# Patient Record
Sex: Male | Born: 1985 | Race: White | Hispanic: No | Marital: Married | Smoking: Former smoker
Health system: Southern US, Community
[De-identification: ages and names within clinical notes are randomized; demographics above are authoritative.]

## PROBLEM LIST (undated history)

## (undated) DIAGNOSIS — E0590 Thyrotoxicosis, unspecified without thyrotoxic crisis or storm: Secondary | ICD-10-CM

## (undated) DIAGNOSIS — J45909 Unspecified asthma, uncomplicated: Secondary | ICD-10-CM

## (undated) DIAGNOSIS — S82899A Other fracture of unspecified lower leg, initial encounter for closed fracture: Secondary | ICD-10-CM

## (undated) DIAGNOSIS — E7800 Pure hypercholesterolemia, unspecified: Secondary | ICD-10-CM

## (undated) DIAGNOSIS — E119 Type 2 diabetes mellitus without complications: Secondary | ICD-10-CM

## (undated) HISTORY — PX: OTHER SURGICAL HISTORY: SHX169

## (undated) HISTORY — DX: Thyrotoxicosis, unspecified without thyrotoxic crisis or storm: E05.90

## (undated) HISTORY — DX: Unspecified asthma, uncomplicated: J45.909

## (undated) HISTORY — DX: Pure hypercholesterolemia, unspecified: E78.00

---
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
## 2007-03-10 ENCOUNTER — Encounter: Payer: Self-pay | Admitting: Family Medicine

## 2012-05-18 HISTORY — PX: MOUTH SURGERY: SHX715

## 2013-09-27 ENCOUNTER — Encounter (INDEPENDENT_AMBULATORY_CARE_PROVIDER_SITE_OTHER): Payer: Self-pay

## 2013-09-27 ENCOUNTER — Encounter: Payer: Self-pay | Admitting: Neurology

## 2013-09-27 ENCOUNTER — Ambulatory Visit (INDEPENDENT_AMBULATORY_CARE_PROVIDER_SITE_OTHER): Payer: BC Managed Care – PPO | Admitting: Neurology

## 2013-09-27 VITALS — BP 116/82 | HR 76 | Ht 71.0 in | Wt 250.0 lb

## 2013-09-27 DIAGNOSIS — R209 Unspecified disturbances of skin sensation: Secondary | ICD-10-CM

## 2013-09-27 DIAGNOSIS — G4733 Obstructive sleep apnea (adult) (pediatric): Secondary | ICD-10-CM

## 2013-09-27 DIAGNOSIS — R202 Paresthesia of skin: Secondary | ICD-10-CM

## 2013-09-27 MED ORDER — GABAPENTIN 100 MG PO CAPS: 300.0000 mg | ORAL_CAPSULE | Freq: Three times a day (TID) | ORAL | Status: DC

## 2013-09-27 NOTE — Progress Notes (Signed)
PATIENT: Jeffery Soto DOB: 1985-11-01  HISTORICAL  Jeffery Soto is a 28 years old right-handed male, is referred by his dentist Dr.Mohorn for evaluation right upper tooth pain.  He had past medical history of hypothyroidism, asthma, hyperlipidemia, obesity,  He had teeth #8, and 9 root canal and crown placement in June 2014, recovered well,   he previously had root canal, and crown done at #7, around April 2015, he noticed shooting burning sensation above his #7, lasting for a few weeks, over the past one week, it has much improved, and at its worst, he described constant burning discomfort, difficulty sleeping at nighttime, missed  He was reevaluated by his dentist he Sep 25 2013, there was no signs of swelling, inflammation, he has already been treated with antibiotics,   He also has a history of obesity, snoring a lot at nighttime, excessive fatigue, sleepiness at daytime  REVIEW OF SYSTEMS: Full 14 system review of systems performed and notable only for hyperlipidemia, hypothyroidism, asthma,   ALLERGIES: Allergies  Allergen Reactions  . Amoxicillin   . Penicillins     HOME MEDICATIONS: No current outpatient prescriptions on file prior to visit.   No current facility-administered medications on file prior to visit.    PAST MEDICAL HISTORY: Past Medical History  Diagnosis Date  . Hyperthyroidism   . Asthma   . High cholesterol     PAST SURGICAL HISTORY: Past Surgical History  Procedure Laterality Date  . None      FAMILY HISTORY: Family History  Problem Relation Age of Onset  . Heart attack Paternal Grandfather   . Irritable bowel syndrome Mother     SOCIAL HISTORY:  History   Social History  . Marital Status: Single    Spouse Name: N/A    Number of Children: 0  . Years of Education: Masters   Occupational History  . Not on file.   Social History Main Topics  . Smoking status: Former Smoker    Quit date: 07/02/2013  . Smokeless tobacco:  Never Used  . Alcohol Use: Yes     Comment: 2 times a week   . Drug Use: No  . Sexual Activity: Not on file   Other Topics Concern  . Not on file   Social History Narrative   Patient lives at home home with girlfriend Merry ProudBrandi.   Patient has no children.    Masters in Audiological scientistaccounting    Patient is not married.    Patient is left handed.    Patient is currently working full time.      PHYSICAL EXAM   Filed Vitals:   09/27/13 0830  BP: 116/82  Pulse: 76  Height: 5\' 11"  (1.803 m)  Weight: 250 lb (113.399 kg)    Not recorded    Body mass index is 34.88 kg/(m^2).   Generalized: In no acute distress  Neck: Supple, no carotid bruits   Cardiac: Regular rate rhythm  Pulmonary: Clear to auscultation bilaterally  Musculoskeletal: No deformity  Neurological examination  Mentation: Alert oriented to time, place, history taking, and causual conversation  Cranial nerve II-XII: Pupils were equal round reactive to light. Extraocular movements were full.  Visual field were full on confrontational test. Bilateral fundi were sharp.  Facial sensation and strength were normal. Hearing was intact to finger rubbing bilaterally. Uvula tongue midline.  Head turning and shoulder shrug and were normal and symmetric.Tongue protrusion into cheek strength was normal.  Motor: Normal tone, bulk and strength.  Sensory: Intact to fine touch, pinprick, preserved vibratory sensation, and proprioception at toes.  Coordination: Normal finger to nose, heel-to-shin bilaterally there was no truncal ataxia  Gait: Rising up from seated position without assistance, normal stance, without trunk ataxia, moderate stride, good arm swing, smooth turning, able to perform tiptoe, and heel walking without difficulty.   Romberg signs: Negative  Deep tendon reflexes: Brachioradialis 2/2, biceps 2/2, triceps 2/2, patellar 2/2, Achilles 2/2, plantar responses were flexor bilaterally.   DIAGNOSTIC DATA (LABS, IMAGING,  TESTING) - I reviewed patient records, labs, notes, testing and imaging myself where available.  ASSESSMENT AND PLAN  Jeffery Soto is a 28 y.o. male complains of   right upper, area discomfort, shooting pain, suggestive of neurogenic pain, over the past couple weeks, it has much improved, he also complains of symptoms suggestive of obstructive sleep apnea,   1. sleep study 2 Neurontin 3 return to clinic in 3 months with [name]. Levert Feinstein.   Daniyla Pfahler, M.D. Ph.D.  [facility] [address], [address] [place], [place] [address] [phone]

## 2013-09-29 ENCOUNTER — Telehealth: Payer: Self-pay | Admitting: Neurology

## 2013-09-29 DIAGNOSIS — R400 Somnolence: Secondary | ICD-10-CM

## 2013-09-29 DIAGNOSIS — R0683 Snoring: Secondary | ICD-10-CM

## 2013-09-29 DIAGNOSIS — E669 Obesity, unspecified: Secondary | ICD-10-CM

## 2013-09-29 NOTE — Telephone Encounter (Signed)
Dr. Terrace Soto..refers patient for attended sleep study.  Height: 5'11  Weight: 250 lbs.  BMI: 34.88  Past Medical History:  Hyperthyroidism  Asthma  High cholesterol   Sleep Symptoms: He also has a history of obesity, snoring a lot at nighttime, excessive fatigue, sleepiness at daytime    Epworth Score: 5 ( I called and spoke with the patient to get his ESS score.  Medications: Fexofenadine HCl ALLEGRA PO Take by mouth as needed. Gabapentin (Cap) NEURONTIN 100 MG Take 3 capsules (300 mg total) by mouth 3 (three) times daily.    Insurance: [employer]  Dr. Zannie Soto's Assessment and Plan:   Jeffery Soto is a 28 y.o. male complains of right upper, area discomfort, shooting pain, suggestive of neurogenic pain, over the past couple weeks, it has much improved, he also complains of symptoms suggestive of obstructive sleep apnea,  1. sleep study  2 Neurontin  3 return to clinic in 3 months with Jeffery Soto. .   Please review patient information and submit instructions for scheduling and orders for sleep technologist.  Thank you!

## 2013-10-04 NOTE — Telephone Encounter (Signed)
Sleep study request review: This patient has an underlying medical history of allergies, obesity and paresthesias and is referred by Dr. Terrace ArabiaYan for an attended sleep study due to a report of snoring, daytime somnolence. I will order a split-night sleep study and see the patient in sleep medicine consultation afterwards. Huston FoleySaima Sherae Santino, MD, PhD [facility])

## 2013-11-29 ENCOUNTER — Ambulatory Visit (INDEPENDENT_AMBULATORY_CARE_PROVIDER_SITE_OTHER): Payer: BC Managed Care – PPO | Admitting: Neurology

## 2013-11-29 ENCOUNTER — Encounter: Payer: Self-pay | Admitting: Neurology

## 2013-11-29 DIAGNOSIS — G4733 Obstructive sleep apnea (adult) (pediatric): Secondary | ICD-10-CM

## 2013-11-29 DIAGNOSIS — R400 Somnolence: Secondary | ICD-10-CM

## 2013-11-29 DIAGNOSIS — E669 Obesity, unspecified: Secondary | ICD-10-CM

## 2013-11-29 DIAGNOSIS — R0683 Snoring: Secondary | ICD-10-CM

## 2013-12-15 ENCOUNTER — Telehealth: Payer: Self-pay | Admitting: Neurology

## 2013-12-15 DIAGNOSIS — G4733 Obstructive sleep apnea (adult) (pediatric): Secondary | ICD-10-CM

## 2013-12-15 NOTE — Telephone Encounter (Signed)
Please call and notify the patient that the recent sleep study did confirm the diagnosis of obstructive sleep apnea and that I recommend treatment for this in the form of CPAP, as he has mild to moderate sleep apnea. This will require a repeat sleep study for proper titration and mask fitting. Please explain to patient and arrange for a CPAP titration study. I have placed an order in the chart. Thanks, Huston FoleySaima Faris Coolman, MD, PhD [facility]

## 2013-12-18 NOTE — Telephone Encounter (Signed)
Called the patient regarding sleep study results.  Advised the patient of finding of mild osa, however Dr. Frances FurbishAthar recommends treatment with CPAP which would require a second sleep study.  Pt agreed and appt was scheduled.  A copy of this report was sent to Dr. Terrace ArabiaYan for processing and the patient

## 2014-01-12 ENCOUNTER — Encounter: Payer: Self-pay | Admitting: Nurse Practitioner

## 2014-01-12 ENCOUNTER — Ambulatory Visit (INDEPENDENT_AMBULATORY_CARE_PROVIDER_SITE_OTHER): Payer: BC Managed Care – PPO | Admitting: Nurse Practitioner

## 2014-01-12 VITALS — BP 126/76 | HR 71 | Ht 71.0 in | Wt 249.8 lb

## 2014-01-12 DIAGNOSIS — G4733 Obstructive sleep apnea (adult) (pediatric): Secondary | ICD-10-CM

## 2014-01-12 DIAGNOSIS — R202 Paresthesia of skin: Secondary | ICD-10-CM

## 2014-01-12 DIAGNOSIS — R209 Unspecified disturbances of skin sensation: Secondary | ICD-10-CM

## 2014-01-12 NOTE — Progress Notes (Signed)
[facility]  PATIENT: Jeffery Soto DOB: 04-16-1986   REASON FOR VISIT: Followup for numbness of the face of obstructive sleep apnea   HISTORY OF PRESENT ILLNESS: Jeffery Soto, 28 year old male returns for followup. He was last seen Dr. Terrace Arabia 09/27/2013. He was placed on gabapentin for his numbness paresthesias of the face particularly in the gums. The gabapentin is beneficial however he does have some breakthrough pain. He has also had a sleep study which was positive for obstructive sleep apnea. He is due to have CPAP titration on September 9. He returns for reevaluation  HISTORY: Jeffery Soto is a 28 years old right-handed male, is referred by his dentist Dr.Mohorn for evaluation right upper tooth pain.  He had past medical history of hypothyroidism, asthma, hyperlipidemia, obesity,  He had teeth #8, and 9 root canal and crown placement in June 2014, recovered well, he previously had root canal, and crown done at #7, around April 2015, he noticed shooting burning sensation above his #7, lasting for a few weeks, over the past one week, it has much improved, and at its worst, he described constant burning discomfort, difficulty sleeping at nighttime, missed  He was reevaluated by his dentist he Sep 25 2013, there was no signs of swelling, inflammation, he has already been treated with antibiotics,  He also has a history of obesity, snoring a lot at nighttime, excessive fatigue, sleepiness at daytime      REVIEW OF SYSTEMS: Full 14 system review of systems performed and notable only for those listed, all others are neg:  Constitutional: N/A  Cardiovascular: N/A  Ear/Nose/Throat: N/A  Skin: N/A  Eyes: N/A  Respiratory: N/A  Gastroitestinal: N/A  Hematology/Lymphatic: N/A  Endocrine: N/A Musculoskeletal:N/A  Allergy/Immunology: N/A  Neurological: Numbness of the gums Psychiatric: N/A Sleep : Obstructive sleep apnea   ALLERGIES: Allergies  Allergen  Reactions  . Amoxicillin   . Penicillins     HOME MEDICATIONS: Outpatient Prescriptions Prior to Visit  Medication Sig Dispense Refill  . Fexofenadine HCl (ALLEGRA PO) Take by mouth as needed.      . gabapentin (NEURONTIN) 100 MG capsule Take 3 capsules (300 mg total) by mouth 3 (three) times daily.  270 capsule  6   No facility-administered medications prior to visit.    PAST MEDICAL HISTORY: Past Medical History  Diagnosis Date  . Hyperthyroidism   . Asthma   . High cholesterol     PAST SURGICAL HISTORY: Past Surgical History  Procedure Laterality Date  . None      FAMILY HISTORY: Family History  Problem Relation Age of Onset  . Heart attack Paternal Grandfather   . Irritable bowel syndrome Mother     SOCIAL HISTORY: History   Social History  . Marital Status: Single    Spouse Name: N/A    Number of Children: 0  . Years of Education: Masters   Occupational History  . Not on file.   Social History Main Topics  . Smoking status: Former Smoker    Quit date: 07/02/2013  . Smokeless tobacco: Never Used  . Alcohol Use: Yes     Comment: 2 times a week   . Drug Use: No  . Sexual Activity: Not on file   Other Topics Concern  . Not on file   Social History Narrative   Patient lives at home home with girlfriend Jeffery Soto.   Patient has no children.    Masters in Audiological scientist    Patient is not married.  Patient is left handed.    Patient is currently working full time.      PHYSICAL EXAM  Filed Vitals:   There is no weight on file to calculate BMI. Generalized: In no acute distress  Neck: Supple, no carotid bruits   Musculoskeletal: No deformity  Neurological examination  Mentation: Alert oriented to time, place, history taking, and causual conversation  Cranial nerve II-XII: Pupils were equal round reactive to light. Extraocular movements were full. Visual field were full on confrontational test. Facial sensation and strength were normal. Hearing was  intact to finger rubbing bilaterally. Uvula tongue midline. Head turning and shoulder shrug and were normal and symmetric.Tongue protrusion into cheek strength was normal.  Motor: Normal tone, bulk and strength.  Sensory: Intact to fine touch, pinprick, preserved vibratory sensation, and proprioception at toes.  Coordination: Normal finger to nose, heel-to-shin bilaterally there was no truncal ataxia  Gait: Rising up from seated position without assistance, normal stance, without trunk ataxia, moderate stride, good arm swing, smooth turning, able to perform tiptoe, and heel walking without difficulty.  Deep tendon reflexes: Brachioradialis 2/2, biceps 2/2, triceps 2/2, patellar 2/2, Achilles 2/2, plantar responses were flexor bilaterally.  DIAGNOSTIC DATA (LABS, IMAGING, TESTING) - ASSESSMENT AND PLAN  28 y.o. year old male  has a past medical history of right upper gum pain suggestive of neurogenic pain much improved with gabapentin. Sleep study shows obstructive sleep apnea, he has not had titration study.  Increase gabapentin to 3 capsules 4 times daily.Just refilled he will call for refill CPAP titration scheduled for September 9 Followup with me in 6 months Nilda Riggs, [facility], [facility], APRN  [facility] [address], [address] [place], [place] [address] [phone]

## 2014-01-12 NOTE — Patient Instructions (Addendum)
Increase gabapentin to 3 capsules 4 times daily. CPAP titration scheduled for September 9 Followup with me in 6 months

## 2014-01-24 ENCOUNTER — Ambulatory Visit (INDEPENDENT_AMBULATORY_CARE_PROVIDER_SITE_OTHER): Payer: BC Managed Care – PPO | Admitting: Neurology

## 2014-01-24 DIAGNOSIS — G4733 Obstructive sleep apnea (adult) (pediatric): Secondary | ICD-10-CM

## 2014-02-07 ENCOUNTER — Telehealth: Payer: Self-pay | Admitting: Neurology

## 2014-02-07 DIAGNOSIS — G4733 Obstructive sleep apnea (adult) (pediatric): Secondary | ICD-10-CM

## 2014-02-07 NOTE — Telephone Encounter (Signed)
Please call and inform patient that I have entered an order for treatment with PAP. He did well during the latest sleep study with CPAP. We will, therefore, arrange for a machine for home use through a DME (durable medical equipment) company of His choice; and I will see the patient back in follow-up in about 6 weeks. Please also explain to the patient that I will be looking out for compliance data downloaded from the machine, which can be done remotely through a modem at times or stored on an SD card in the back of the machine. At the time of the followup appointment we will discuss sleep study results and how it is going with PAP treatment at home. Please advise patient to bring His machine at the time of the visit; at least for the first visit, even though this is cumbersome. Bringing the machine for every visit after that may not be needed, but often helps for the first visit. Please also make sure, the patient has a follow-up appointment with me in about 6 weeks from the setup date, thanks.   Atziry Baranski, MD, PhD [facility] ([facility]  

## 2014-02-08 ENCOUNTER — Encounter: Payer: Self-pay | Admitting: Neurology

## 2014-02-13 NOTE — Telephone Encounter (Signed)
Patient was contacted and provided the results of his CPAP titration study.  Patient was informed that [facility] would be his DME company and he was fine with that.  Patient was instructed to contact office to set up a 6-8 week follow up once he was started on the therapy.  Patient was informed that a copy would be mailed out to him and a copy would be provided to Dr. Levert FeinsteinYijun Yan.

## 2014-03-22 ENCOUNTER — Encounter: Payer: Self-pay | Admitting: Neurology

## 2014-05-19 ENCOUNTER — Other Ambulatory Visit: Payer: Self-pay | Admitting: Neurology

## 2014-05-20 NOTE — Telephone Encounter (Signed)
Last OV note says: Increase gabapentin to 3 capsules 4 times daily

## 2014-05-29 ENCOUNTER — Encounter: Payer: Self-pay | Admitting: Internal Medicine

## 2014-08-13 ENCOUNTER — Telehealth: Payer: Self-pay | Admitting: *Deleted

## 2014-08-13 ENCOUNTER — Encounter: Payer: Self-pay | Admitting: *Deleted

## 2014-08-20 ENCOUNTER — Ambulatory Visit: Payer: BC Managed Care – PPO | Admitting: Nurse Practitioner

## 2014-09-12 NOTE — Telephone Encounter (Signed)
Jonne Plyashia Wright CMA, no longer working here.

## 2014-09-15 ENCOUNTER — Other Ambulatory Visit: Payer: Self-pay | Admitting: Neurology

## 2014-10-15 ENCOUNTER — Other Ambulatory Visit: Payer: Self-pay | Admitting: Neurology

## 2014-10-25 ENCOUNTER — Other Ambulatory Visit: Payer: Self-pay | Admitting: Neurology

## 2014-11-04 ENCOUNTER — Other Ambulatory Visit: Payer: Self-pay | Admitting: Neurology

## 2015-04-24 ENCOUNTER — Ambulatory Visit (INDEPENDENT_AMBULATORY_CARE_PROVIDER_SITE_OTHER): Payer: 59 | Admitting: Emergency Medicine

## 2015-04-24 VITALS — BP 136/84 | HR 82 | Temp 98.5°F | Resp 18 | Ht 70.0 in | Wt 263.4 lb

## 2015-04-24 DIAGNOSIS — R6883 Chills (without fever): Secondary | ICD-10-CM | POA: Diagnosis not present

## 2015-04-24 DIAGNOSIS — R5383 Other fatigue: Secondary | ICD-10-CM | POA: Diagnosis not present

## 2015-04-24 DIAGNOSIS — R42 Dizziness and giddiness: Secondary | ICD-10-CM | POA: Diagnosis not present

## 2015-04-24 DIAGNOSIS — R002 Palpitations: Secondary | ICD-10-CM | POA: Diagnosis not present

## 2015-04-24 LAB — COMPREHENSIVE METABOLIC PANEL
ALK PHOS: 61 U/L (ref 40–115)
ALT: 61 U/L — AB (ref 9–46)
AST: 41 U/L — ABNORMAL HIGH (ref 10–40)
Albumin: 4.6 g/dL (ref 3.6–5.1)
BUN: 12 mg/dL (ref 7–25)
CO2: 23 mmol/L (ref 20–31)
Calcium: 9.8 mg/dL (ref 8.6–10.3)
Chloride: 99 mmol/L (ref 98–110)
Creat: 0.81 mg/dL (ref 0.60–1.35)
GLUCOSE: 86 mg/dL (ref 65–99)
POTASSIUM: 4.8 mmol/L (ref 3.5–5.3)
Sodium: 141 mmol/L (ref 135–146)
Total Bilirubin: 0.5 mg/dL (ref 0.2–1.2)
Total Protein: 7.5 g/dL (ref 6.1–8.1)

## 2015-04-24 LAB — POCT URINALYSIS DIP (MANUAL ENTRY)
BILIRUBIN UA: NEGATIVE
Bilirubin, UA: NEGATIVE
Blood, UA: NEGATIVE
Glucose, UA: NEGATIVE
Leukocytes, UA: NEGATIVE
Nitrite, UA: NEGATIVE
Spec Grav, UA: 1.02
UROBILINOGEN UA: 0.2
pH, UA: 5.5

## 2015-04-24 LAB — GLUCOSE, POCT (MANUAL RESULT ENTRY): POC Glucose: 88 mg/dl (ref 70–99)

## 2015-04-24 LAB — POCT CBC
GRANULOCYTE PERCENT: 65.5 % (ref 37–80)
HCT, POC: 42 % — AB (ref 43.5–53.7)
HEMOGLOBIN: 14.6 g/dL (ref 14.1–18.1)
Lymph, poc: 2.1 (ref 0.6–3.4)
MCH: 28.9 pg (ref 27–31.2)
MCHC: 34.8 g/dL (ref 31.8–35.4)
MCV: 83.2 fL (ref 80–97)
MID (cbc): 0.5 (ref 0–0.9)
MPV: 8.2 fL (ref 0–99.8)
PLATELET COUNT, POC: 291 10*3/uL (ref 142–424)
POC Granulocyte: 5 (ref 2–6.9)
POC LYMPH PERCENT: 27.8 %L (ref 10–50)
POC MID %: 6.7 % (ref 0–12)
RBC: 5.05 M/uL (ref 4.69–6.13)
RDW, POC: 13.1 %
WBC: 7.6 10*3/uL (ref 4.6–10.2)

## 2015-04-24 LAB — LIPID PANEL
CHOL/HDL RATIO: 4.6 ratio (ref <=5.0)
Cholesterol: 227 mg/dL — ABNORMAL HIGH (ref 125–200)
HDL: 49 mg/dL (ref >=40)
LDL CALC: 141 mg/dL — AB (ref <130)
Triglycerides: 187 mg/dL — ABNORMAL HIGH (ref <150)
VLDL: 37 mg/dL — AB (ref <30)

## 2015-04-24 LAB — POC MICROSCOPIC URINALYSIS (UMFC): Mucus: ABSENT

## 2015-04-24 LAB — TSH: TSH: 4.655 u[IU]/mL — ABNORMAL HIGH (ref 0.350–4.500)

## 2015-04-24 LAB — POCT GLYCOSYLATED HEMOGLOBIN (HGB A1C): Hemoglobin A1C: 5.5

## 2015-04-24 LAB — HEMOGLOBIN A1C: HEMOGLOBIN A1C: 5.5 % (ref 4.0–6.0)

## 2015-04-24 NOTE — Progress Notes (Signed)
Subjective:  Patient ID: Jeffery Soto, male    DOB: [birth year]  Age: 29 y.o. MRN: [id number]  CC: Dizziness and Abdominal Pain   HPI Jeffery Soto presents  patient ill since Saturday with the sensation chills and night sweats. No nasal congestion postnasal drainage. No cough. No wheezing or shortness breath. No nausea or vomiting. No stool change. No pain or pressure in his ears. No sore throat. He has no rash. Today's developed a sensation that he is somewhat dizzy and has had difficulty concentrating yesterday. He has no history of injury. No antecedent illness he is concerned that he might be a diabetic.  History Jeffery Soto has a past medical history of Hyperthyroidism; Asthma; and High cholesterol.   He has past surgical history that includes None and Mouth surgery (2014).   His  family history includes Arthritis in his mother; Heart attack in his paternal grandfather; Irritable bowel syndrome in his mother.  He   reports that he quit smoking about 21 months ago. He has never used smokeless tobacco. He reports that he drinks alcohol. He reports that he does not use illicit drugs.  Outpatient Prescriptions Prior to Visit  Medication Sig Dispense Refill  . Fexofenadine HCl (ALLEGRA PO) Take by mouth as needed.    . gabapentin (NEURONTIN) 100 MG capsule Take 300 mg by mouth 4 (four) times daily.    Marland Kitchen. gabapentin (NEURONTIN) 100 MG capsule TAKE 3 CAPSULES (300 MG TOTAL) BY MOUTH 4 (FOUR) TIMES DAILY. 120 capsule 0   No facility-administered medications prior to visit.    Social History   Social History  . Marital Status: Single    Spouse Name: N/A  . Number of Children: 0  . Years of Education: Masters   Social History Main Topics  . Smoking status: Former Smoker    Quit date: 07/02/2013  . Smokeless tobacco: Never Used  . Alcohol Use: Yes     Comment: 2 times a week   . Drug Use: No  . Sexual Activity: Not Asked   Other Topics Concern  . None   Social History  Narrative   Patient lives at home home with girlfriend Jeffery Soto.   Patient has no children.    Masters in Audiological scientistaccounting    Patient is not married.    Patient is left handed.    Patient is currently working full time.      Review of Systems  Constitutional: Positive for chills and fatigue. Negative for fever and appetite change.  HENT: Negative for congestion, ear pain, postnasal drip, sinus pressure and sore throat.   Eyes: Negative for pain and redness.  Respiratory: Negative for cough, shortness of breath and wheezing.   Cardiovascular: Positive for palpitations. Negative for leg swelling.  Gastrointestinal: Negative for nausea, vomiting, abdominal pain, diarrhea, constipation and blood in stool.  Endocrine: Negative for polyuria.  Genitourinary: Negative for dysuria, urgency, frequency and flank pain.  Musculoskeletal: Negative for gait problem.  Skin: Negative for rash.  Neurological: Positive for dizziness. Negative for weakness and headaches.  Psychiatric/Behavioral: Positive for decreased concentration. Negative for confusion. The patient is not nervous/anxious.     Objective:  BP 136/84 mmHg  Pulse 82  Temp(Src) 98.5 F (36.9 C) (Oral)  Resp 18  Ht 5\' 10"  (1.778 m)  Wt 263 lb 6.4 oz (119.477 kg)  BMI 37.79 kg/m2  SpO2 98%  Physical Exam  Constitutional: He is oriented to person, place, and time. He appears well-developed and well-nourished. No distress.  HENT:  Head: Normocephalic and atraumatic.  Right Ear: External ear normal.  Left Ear: External ear normal.  Nose: Nose normal.  Eyes: Conjunctivae and EOM are normal. Pupils are equal, round, and reactive to light. No scleral icterus.  Neck: Normal range of motion. Neck supple. No tracheal deviation present.  Cardiovascular: Normal rate, regular rhythm and normal heart sounds.   Pulmonary/Chest: Effort normal. No respiratory distress. He has no wheezes. He has no rales.  Abdominal: He exhibits no mass. There is no  tenderness. There is no rebound and no guarding.  Musculoskeletal: He exhibits no edema.  Lymphadenopathy:    He has no cervical adenopathy.  Neurological: He is alert and oriented to person, place, and time. Coordination normal.  Skin: Skin is warm and dry. No rash noted.  Psychiatric: He has a normal mood and affect. His behavior is normal.      Assessment & Plan:   Jeffery Soto was seen today for dizziness and abdominal pain.  Diagnoses and all orders for this visit:  Dizzy -     POCT CBC -     POCT glucose (manual entry) -     POCT glycosylated hemoglobin (Hb A1C) -     POCT urinalysis dipstick -     POCT Microscopic Urinalysis (UMFC) -     Comprehensive metabolic panel -     TSH -     Lipid panel -     EKG 12-Lead   I am having Jeffery Soto maintain his Fexofenadine HCl (ALLEGRA PO) and gabapentin.  No orders of the defined types were placed in this encounter.    Appropriate red flag conditions were discussed with the patient as well as actions that should be taken.  Patient expressed his understanding.  Follow-up: Return if symptoms worsen or fail to improve.  Carmelina Dane, MD   Results for orders placed or performed in visit on 04/24/15  POCT CBC  Result Value Ref Range   WBC 7.6 4.6 - 10.2 K/uL   Lymph, poc 2.1 0.6 - 3.4   POC LYMPH PERCENT 27.8 10 - 50 %L   MID (cbc) 0.5 0 - 0.9   POC MID % 6.7 0 - 12 %M   POC Granulocyte 5.0 2 - 6.9   Granulocyte percent 65.5 37 - 80 %G   RBC 5.05 4.69 - 6.13 M/uL   Hemoglobin 14.6 14.1 - 18.1 g/dL   HCT, POC 16.1 (A) 09.6 - 53.7 %   MCV 83.2 80 - 97 fL   MCH, POC 28.9 27 - 31.2 pg   MCHC 34.8 31.8 - 35.4 g/dL   RDW, POC 04.5 %   Platelet Count, POC 291 142 - 424 K/uL   MPV 8.2 0 - 99.8 fL  POCT glucose (manual entry)  Result Value Ref Range   POC Glucose 88 70 - 99 mg/dl  POCT glycosylated hemoglobin (Hb A1C)  Result Value Ref Range   Hemoglobin A1C 5.5   POCT urinalysis dipstick  Result Value Ref Range    Color, UA yellow yellow   Clarity, UA clear clear   Glucose, UA negative negative   Bilirubin, UA negative negative   Ketones, POC UA negative negative   Spec Grav, UA 1.020    Blood, UA negative negative   pH, UA 5.5    Protein Ur, POC trace (A) negative   Urobilinogen, UA 0.2    Nitrite, UA Negative Negative   Leukocytes, UA Negative Negative  POCT Microscopic Urinalysis ([facility])  Result Value Ref Range   WBC,UR,HPF,POC None None WBC/hpf   RBC,UR,HPF,POC None None RBC/hpf   Bacteria None None, Too numerous to count   Mucus Absent Absent   Epithelial Cells, UR Per Microscopy None None, Too numerous to count cells/hpf

## 2015-04-24 NOTE — Patient Instructions (Signed)

## 2015-04-25 ENCOUNTER — Other Ambulatory Visit: Payer: Self-pay | Admitting: Emergency Medicine

## 2015-04-25 MED ORDER — LEVOTHYROXINE SODIUM 75 MCG PO TABS: 75.0000 ug | ORAL_TABLET | Freq: Every day | ORAL | Status: AC

## 2015-05-02 ENCOUNTER — Encounter: Payer: Self-pay | Admitting: Family Medicine

## 2015-05-27 ENCOUNTER — Ambulatory Visit: Payer: 59 | Admitting: Neurology

## 2015-05-30 ENCOUNTER — Ambulatory Visit (INDEPENDENT_AMBULATORY_CARE_PROVIDER_SITE_OTHER): Payer: 59 | Admitting: Neurology

## 2015-05-30 ENCOUNTER — Encounter: Payer: Self-pay | Admitting: Neurology

## 2015-05-30 VITALS — BP 136/88 | HR 83 | Resp 18 | Ht 71.0 in | Wt 271.0 lb

## 2015-05-30 DIAGNOSIS — G4733 Obstructive sleep apnea (adult) (pediatric): Secondary | ICD-10-CM

## 2015-05-30 DIAGNOSIS — Z9989 Dependence on other enabling machines and devices: Principal | ICD-10-CM

## 2015-05-30 NOTE — Progress Notes (Signed)
Subjective:    Patient ID: Jeffery Soto is a 30 y.o. male.  HPI     Huston Foley, MD, PhD [facility] [address] [address] [place], [place] [address]  Dear Vivia Ewing,   I saw your patient, Jeffery Soto, upon your kind request in my clinic today for initial consultation of his obstructive sleep apnea. The patient is unaccompanied today. As you know, Jeffery Soto is a 30 year old right-handed gentleman with an underlying medical history of allergies, previous oral paresthesias, recent diagnosis of hypothyroidism, and obesity, who had a baseline sleep study in July 2015 and a CPAP titration study in September 2015. He did not make a follow-up appointment after his sleep studies fo discussion of his sleep studies and management of his OSA. This is our first visit today. I talked to him about his sleep test results. His baseline sleep study from 11/29/2013 showed a sleep efficiency of 87.8% with a sleep latency of 10 minutes and wake after sleep onset of 41 minutes with mild sleep fragmentation noted. He had normal percentages for light stage sleep, a normal percentage of slow-wave sleep and a new normal percentage of REM sleep with a mildly prolonged REM latency. He had no significant PLMS, EKG or EEG changes. Had moderate to loud snoring. Total AHI was 5.9 per hour, rising to 23.8 per hour during REM sleep. Average oxygen saturation was 94%, nadir was 85%.  Based on his test results I invited him back for a titration study.  He had this on 01/24/2014. Sleep efficiency was 93.5%, sleep latency was 9 minutes, wake after sleep onset 18.5 minutes with mild sleep fragmentation noted. He had a normal arousal index. He had an increased percentage of stage II sleep, a mildly decreased percentage of slow-wave sleep and a mildly decreased percentage of REM sleep with a prolonged REM latency. He had no significant PLMS, EKG or EEG changes. Average oxygen saturation was 94%, nadir  was 88%. CPAP was titrated from 5 cm to 7 cm. AHI was 0 per hour on the final pressure. Supine REM sleep was achieved.  Based on his test results I prescribed CPAP therapy for home use. I previously reviewed his CPAP compliance data from 02/20/2014 through 03/21/2014 which is a total of 30 days during which time he used his machine every night with percent used days greater than 4 hours at 83%, indicating very good compliance with an average usage of 5 hours and 54 minutes, residual AHI low at 0.7 per hour, leaked low with the 95th percentile at 4.4 L/m on a pressure of 7 cm with EPR of 2. Today, 05/30/2015: I reviewed his CPAP compliance data from 04/29/2015 through 05/28/2015 which is a total of 30 days during which time he used his machine 24 days with percent used days greater than 4 hours at 63%, indicating suboptimal compliance with an average usage of 4 hours and 39 minutes, residual AHI low at 0.3 per hour, leak low with the 95th percentile at 0.1 L/m on a pressure of 7 cm with EPR of 2. Looking at his last 90 day compliance data, he was 73% compliant. Today, 05/30/2015: He reports feeling much better with regards to his sleep since he started CPAP therapy in 2015. He takes care of his machine in his mask. He needs supplies however. He still has the original mask and supplies. His DME company is [employer] [facility] [employer]. He is trying to lose weight but did gain some. He now  has a treadmill and uses this at least 2 or 3 times per week. His oral paresthesias have eventually resolved. He was using gabapentin for some time and was abstaining from alcohol as it was a trigger of his oral/gum pain after tooth surgery. He got married in October. He goes to bed around 9 or 10 PM. Rise time is between 4:30 and 6 AM. His wife is a paramedic and he tries to adhere to her sleep schedule. He does not watch TV in bed. He likes to read some. 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 He drinks alcohol  occasionally and resumed occasional alcohol consumption after October 2016. He is a nonsmoker. He drinks quite a bit of coffee, about 20 oz or little more per day. He does not drink sodas typically. He denies restless leg symptoms, morning headaches or nocturia. He feels that he wakes up better rested when he uses CPAP. The recent lapse in treatment was secondary to a sinus infection. He does get tonsillitis about 2 or 3 times per year.   His Past Medical History Is Significant For: Past Medical History  Diagnosis Date  . Hyperthyroidism   . Asthma   . High cholesterol     His Past Surgical History Is Significant For: Past Surgical History  Procedure Laterality Date  . None    . Mouth surgery  2014    His Family History Is Significant For: Family History  Problem Relation Age of Onset  . Heart attack Paternal Grandfather   . Irritable bowel syndrome Mother   . Arthritis Mother     His Social History Is Significant For: Social History   Social History  . Marital Status: Married    Spouse Name: N/A  . Number of Children: 0  . Years of Education: Masters   Social History Main Topics  . Smoking status: Former Smoker    Quit date: 07/02/2013  . Smokeless tobacco: Never Used  . Alcohol Use: Yes     Comment: 2 times a week   . Drug Use: No  . Sexual Activity: Not Asked   Other Topics Concern  . None   Social History Narrative   Patient lives at home home with girlfriend Merry Proud.   Patient has no children.    Masters in Audiological scientist    Patient is not married.    Patient is left handed.    Patient is currently working full time.     His Allergies Are:  Allergies  Allergen Reactions  . Amoxicillin   . Penicillins   :   His Current Medications Are:  Outpatient Encounter Prescriptions as of 05/30/2015  Medication Sig  . Fexofenadine HCl (ALLEGRA PO) Take by mouth as needed.  Marland Kitchen levothyroxine (SYNTHROID, LEVOTHROID) 75 MCG tablet Take 1 tablet (75 mcg total) by mouth  daily. Follow up for repeat testing in 2 months  . [DISCONTINUED] gabapentin (NEURONTIN) 100 MG capsule Take 300 mg by mouth 4 (four) times daily.   No facility-administered encounter medications on file as of 05/30/2015.  :  Review of Systems:  Out of a complete 14 point review of systems, all are reviewed and negative with the exception of these symptoms as listed below:   Review of Systems  Neurological:       Patient is on CPAP, feels like he is doing well on it. He is in need of new supplies. No new concerns.     Objective:  Neurologic Exam  Physical Exam Physical Examination:   Filed Vitals:   05/30/15 0932  BP: 136/88  Pulse: 83  Resp: 18    General Examination: The patient is a very pleasant 30 y.o. male in no acute distress. He appears well-developed and well-nourished and very well groomed. He is obese.  HEENT: Normocephalic, atraumatic, pupils are equal, round and reactive to light and accommodation. Funduscopic exam is normal with sharp disc margins noted. Extraocular tracking is good without limitation to gaze excursion or nystagmus noted. Normal smooth pursuit is noted. Hearing is grossly intact. Face is symmetric with normal facial animation and normal facial sensation. Speech is clear with no dysarthria noted. There is no hypophonia. There is no lip, neck/head, jaw or voice tremor. Neck is supple with full range of passive and active motion. There are no carotid bruits on auscultation. Oropharynx exam reveals: mild mouth dryness, adequate dental hygiene and moderate airway crowding, due to tonsils of 1-2+ bilaterally, and narrow airway entry. Pharynx is mildly erythematous appearing and tonsils are cryptic in appearance. Mallampati is class I. Tongue protrudes centrally and palate elevates symmetrically. Neck size is 18.5 inches. He has a Mild overbite. Nasal inspection reveals no significant nasal mucosal bogginess or redness and no septal deviation.   Chest: Clear to  auscultation without wheezing, rhonchi or crackles noted.  Heart: S1+S2+0, regular and normal without murmurs, rubs or gallops noted.   Abdomen: Soft, non-tender and non-distended with normal bowel sounds appreciated on auscultation.  Extremities: There is no pitting edema in the distal lower extremities bilaterally. Pedal pulses are intact.  Skin: Warm and dry without trophic changes noted.   Musculoskeletal: exam reveals no obvious joint deformities, tenderness or joint swelling or erythema.   Neurologically:  Mental status: The patient is awake, alert and oriented in all 4 spheres. His immediate and remote memory, attention, language skills and fund of knowledge are appropriate. There is no evidence of aphasia, agnosia, apraxia or anomia. Speech is clear with normal prosody and enunciation. Thought process is linear. Mood is normal and affect is normal.  Cranial nerves II - XII are as described above under HEENT exam. In addition: shoulder shrug is normal with equal shoulder height noted. Motor exam: Normal bulk, strength and tone is noted. There is no drift, tremor or rebound. Romberg is negative. Reflexes are 2+ throughout. Babinski: Toes are flexor bilaterally. Fine motor skills and coordination: intact with normal finger taps, normal hand movements, normal rapid alternating patting, normal foot taps and normal foot agility.  Cerebellar testing: No dysmetria or intention tremor on finger to nose testing. Heel to shin is unremarkable bilaterally. There is no truncal or gait ataxia.  Sensory exam: intact to light touch, pinprick, vibration, temperature sense in the upper and lower extremities, with the exception that he reports his right big toe is numb since he had a minor injury to it. He was told that he had a bone spur.  Gait, station and balance: He stands easily. No veering to one side is noted. No leaning to one side is noted. Posture is age-appropriate and stance is narrow based. Gait  shows normal stride length and normal pace. No problems turning are noted. He turns en bloc. Tandem walk is unremarkable.   Assessment and Plan:   In summary, Jeffery Soto is a very pleasant 30 y.o.-year old male with an underlying medical history of allergies, previous oral paresthesias, recent diagnosis of hypothyroidism, and obesity, who presents for initial consultation of his obstructive  sleep apnea, after sleep study testing in 2015. He had baseline sleep study testing in July and a CPAP titration study in September 2015. He is compliant with CPAP therapy. He is reporting good results with regards to his sleep. He is motivated to continue CPAP therapy. He is working on weight loss. He is commended for his CPAP compliance, neurological exam is nonfocal. We talked about sleep apnea, its prognosis and treatment options. He is advised to consider ENT evaluation for recurrent tonsillitis. From my end of things he is doing well. He is advised to follow-up on a yearly basis. I placed an order for CPAP supplies. We talked about his sleep study results in detail today and also went over his compliance data.  I answered all his questions today and the patient was in agreement with the plan.   Thank you very much for allowing me to participate in the care of this nice patient. If I can be of any further assistance to you please do not hesitate to talk to me.   Sincerely,   Huston FoleySaima Catricia Scheerer, MD, PhD

## 2015-05-30 NOTE — Patient Instructions (Addendum)
Please continue using your CPAP regularly. While your insurance requires that you use CPAP at least 4 hours each night on 70% of the nights, I recommend, that you not skip any nights and use it throughout the night if you can. Getting used to CPAP and staying with the treatment long term does take time and patience and discipline. Untreated obstructive sleep apnea when it is moderate to severe can have an adverse impact on cardiovascular health and raise her risk for heart disease, arrhythmias, hypertension, congestive heart failure, stroke and diabetes. Untreated obstructive sleep apnea causes sleep disruption, nonrestorative sleep, and sleep deprivation. This can have an impact on your day to day functioning and cause daytime sleepiness and impairment of cognitive function, memory loss, mood disturbance, and problems focussing. Using CPAP regularly can improve these symptoms.  I will see you back in a year for sleep apnea.

## 2015-07-25 ENCOUNTER — Other Ambulatory Visit: Payer: Self-pay | Admitting: Internal Medicine

## 2015-07-25 DIAGNOSIS — E291 Testicular hypofunction: Secondary | ICD-10-CM

## 2015-08-04 ENCOUNTER — Ambulatory Visit
Admission: RE | Admit: 2015-08-04 | Discharge: 2015-08-04 | Disposition: A | Discharge status: Home / Self Care | Payer: Self-pay | Source: Ambulatory Visit | Attending: Internal Medicine | Admitting: Internal Medicine

## 2015-08-04 DIAGNOSIS — E291 Testicular hypofunction: Secondary | ICD-10-CM

## 2015-08-04 MED ORDER — GADOBENATE DIMEGLUMINE 529 MG/ML IV SOLN
10.0000 mL | Freq: Once | INTRAVENOUS | Status: AC | PRN
  Administered 2015-08-04: 10 mL via INTRAVENOUS

## 2016-03-02 ENCOUNTER — Ambulatory Visit (INDEPENDENT_AMBULATORY_CARE_PROVIDER_SITE_OTHER): Payer: 59 | Admitting: Sports Medicine

## 2016-03-02 DIAGNOSIS — S92812D Other fracture of left foot, subsequent encounter for fracture with routine healing: Secondary | ICD-10-CM

## 2016-03-18 ENCOUNTER — Encounter (INDEPENDENT_AMBULATORY_CARE_PROVIDER_SITE_OTHER): Payer: Self-pay | Admitting: Sports Medicine

## 2016-03-18 ENCOUNTER — Ambulatory Visit (INDEPENDENT_AMBULATORY_CARE_PROVIDER_SITE_OTHER): Payer: 59 | Admitting: Sports Medicine

## 2016-03-18 ENCOUNTER — Ambulatory Visit (INDEPENDENT_AMBULATORY_CARE_PROVIDER_SITE_OTHER): Payer: 59

## 2016-03-18 VITALS — BP 140/96 | HR 94 | Ht 71.0 in | Wt 255.0 lb

## 2016-03-18 DIAGNOSIS — M79675 Pain in left toe(s): Secondary | ICD-10-CM | POA: Diagnosis not present

## 2016-03-18 DIAGNOSIS — S92812D Other fracture of left foot, subsequent encounter for fracture with routine healing: Secondary | ICD-10-CM

## 2016-03-18 NOTE — Progress Notes (Signed)
   Jeffery Soto - 30 y.o. male MRN [id number]  Date of birth: 05-05-1986  Office Visit Note: Visit Date: 03/18/2016 PCP: Alysia PennaHOLWERDA, SCOTT, MD Referred by: Alysia PennaHolwerda, Scott, MD  Subjective: Chief Complaint  Patient presents with  . Left Foot - Follow-up  . Left GRT Toe   HPI: Patient states doing good.  Not using scooter.  Wearing fracture boot.  No pain, but feels different when taking boot off to shower.  General stiffness when out of the boot. No significant pain. Not taking any medications..    ROS Otherwise per HPI.  Assessment & Plan: Visit Diagnoses:  1. Pain of left great toe   2. Closed fracture of sesamoid bone of left foot with routine healing, subsequent encounter     Plan: Findings:  Healing but still minimally tender.  Transition to [employer] Shoe for an additional 2 weeks.  Will plan to come out of shoe once non-tender.   Avoid Hyperflexion but okay for normal day to day activities in PO shoe.      Meds & Orders: No orders of the defined types were placed in this encounter.   Orders Placed This Encounter  Procedures  . XR Toe Great Left    Follow-up: Return in about 2 weeks (around 04/01/2016) for repeat clinical exam.   Procedures: No procedures performed  No notes on file   Clinical History: No specialty comments available.  He reports that he quit smoking about 2 years ago. He has never used smokeless tobacco.   Recent Labs  04/24/15 04/24/15 0957  HGBA1C 5.5 5.5    Objective:  VS:  HT:5\' 11"  (180.3 cm)   WT:255 lb (115.7 kg)  BMI:35.6    BP:(!) 140/96  HR:94bpm  TEMP: ( )  RESP:  Physical Exam  Constitutional: He appears well-developed and well-nourished. No distress.  HENT:  Head: Normocephalic and atraumatic.  Pulmonary/Chest: Effort normal. No respiratory distress.  Musculoskeletal:  Left foot: Small amount of residual pain over the medial aspect of the lateral aspect of the great toe. No pain with flexion or extension. DP & PT pulses  2+/4.  Neurological: He is alert.  Appropriately interactive.  Skin: Skin is warm and dry. No rash noted. He is not diaphoretic. No erythema. No pallor.  Psychiatric: He has a normal mood and affect. His behavior is normal. Judgment and thought content normal.    Ortho Exam Imaging: Xr Toe Great Left  Result Date: 03/20/2016 Bipartate sesamoid bone with minimal callus formation from chondral separation.   Past Medical/Family/Surgical/Social History: Medications & Allergies reviewed per EMR Patient Active Problem List   Diagnosis Date Noted  . Paresthesia 09/27/2013  . Obstructive sleep apnea 09/27/2013   Past Medical History:  Diagnosis Date  . Asthma   . High cholesterol   . Hyperthyroidism    Family History  Problem Relation Age of Onset  . Heart attack Paternal Grandfather   . Irritable bowel syndrome Mother   . Arthritis Mother    Past Surgical History:  Procedure Laterality Date  . MOUTH SURGERY  2014  . None     Social History   Occupational History  . Not on file.   Social History Main Topics  . Smoking status: Former Smoker    Quit date: 07/02/2013  . Smokeless tobacco: Never Used  . Alcohol use Yes     Comment: 2 times a week   . Drug use: No  . Sexual activity: Not on file

## 2016-04-01 ENCOUNTER — Ambulatory Visit (INDEPENDENT_AMBULATORY_CARE_PROVIDER_SITE_OTHER): Payer: 59 | Admitting: Sports Medicine

## 2016-04-01 ENCOUNTER — Encounter (INDEPENDENT_AMBULATORY_CARE_PROVIDER_SITE_OTHER): Payer: Self-pay | Admitting: Sports Medicine

## 2016-04-01 VITALS — BP 135/80 | HR 95 | Ht 71.0 in | Wt 255.0 lb

## 2016-04-01 DIAGNOSIS — S92812D Other fracture of left foot, subsequent encounter for fracture with routine healing: Secondary | ICD-10-CM

## 2016-04-01 DIAGNOSIS — M79675 Pain in left toe(s): Secondary | ICD-10-CM

## 2016-04-01 NOTE — Progress Notes (Signed)
   Jeffery Soto N Wainwright - 30 y.o. male MRN [id number]  Date of birth: 08/27/1985  Office Visit Note: Visit Date: 04/01/2016 PCP: Alysia PennaHOLWERDA, SCOTT, MD Referred by: Alysia PennaHolwerda, Scott, MD  Subjective: Chief Complaint  Patient presents with  . Left Foot - Follow-up  . Left great toe    Patient wearing a postop shoe.  States doing good.     HPI: Doing well. No significant pain. Has been wearing postoperative shoe. While walking barefoot this does not seem to cause any significant discomfort for him.    ROS Otherwise per HPI.  Assessment & Plan: Visit Diagnoses:  1. Pain of left great toe   2. Closed fracture of sesamoid bone of left foot with routine healing, subsequent encounter     Plan: Findings:  No focal tenderness today on exam. Returned activities slowly over the next several weeks. No restrictions at this time.    Meds & Orders: No orders of the defined types were placed in this encounter.  No orders of the defined types were placed in this encounter.   Follow-up: Return if symptoms worsen or fail to improve.   Procedures: No procedures performed  No notes on file   Clinical History: No specialty comments available.  He reports that he quit smoking about 2 years ago. He has never used smokeless tobacco.   Recent Labs  04/24/15 04/24/15 0957  HGBA1C 5.5 5.5    Objective:  VS:  HT:5\' 11"  (180.3 cm)   WT:255 lb (115.7 kg)  BMI:35.6    BP:135/80  HR:95bpm  TEMP: ( )  RESP:  Physical Exam  Ortho Exam  Adult male. No acute distress. Alert & appropriate. Left foot: Well aligned no significant deformity. He has no pain along the plantar aspect of the foot sesamoids are nontender. Does have somewhat limited extension of the great toe as well as tight heel cords but dorsiflexion is to greater than 95. DP & PT pulses 2+/4. Imaging: No results found.  Past Medical/Family/Surgical/Social History: Medications & Allergies reviewed per EMR Patient Active Problem List   Diagnosis Date Noted  . Paresthesia 09/27/2013  . Obstructive sleep apnea 09/27/2013   Past Medical History:  Diagnosis Date  . Asthma   . High cholesterol   . Hyperthyroidism    Family History  Problem Relation Age of Onset  . Heart attack Paternal Grandfather   . Irritable bowel syndrome Mother   . Arthritis Mother    Past Surgical History:  Procedure Laterality Date  . MOUTH SURGERY  2014  . None     Social History   Occupational History  . Not on file.   Social History Main Topics  . Smoking status: Former Smoker    Quit date: 07/02/2013  . Smokeless tobacco: Never Used  . Alcohol use Yes     Comment: 2 times a week   . Drug use: No  . Sexual activity: Not on file

## 2016-04-16 ENCOUNTER — Ambulatory Visit: Payer: 59 | Admitting: Neurology

## 2016-04-21 ENCOUNTER — Encounter: Payer: Self-pay | Admitting: Neurology

## 2016-04-21 ENCOUNTER — Ambulatory Visit (INDEPENDENT_AMBULATORY_CARE_PROVIDER_SITE_OTHER): Payer: 59 | Admitting: Neurology

## 2016-04-21 VITALS — BP 151/89 | HR 94 | Resp 20 | Ht 70.0 in | Wt 291.0 lb

## 2016-04-21 DIAGNOSIS — G4733 Obstructive sleep apnea (adult) (pediatric): Secondary | ICD-10-CM | POA: Diagnosis not present

## 2016-04-21 DIAGNOSIS — Z9989 Dependence on other enabling machines and devices: Secondary | ICD-10-CM

## 2016-04-21 DIAGNOSIS — R635 Abnormal weight gain: Secondary | ICD-10-CM | POA: Diagnosis not present

## 2016-04-21 NOTE — Patient Instructions (Signed)
Please continue using your CPAP regularly. While your insurance requires that you use CPAP at least 4 hours each night on 70% of the nights, I recommend, that you not skip any nights and use it throughout the night if you can. Getting used to CPAP and staying with the treatment long term does take time and patience and discipline. Untreated obstructive sleep apnea when it is moderate to severe can have an adverse impact on cardiovascular health and raise her risk for heart disease, arrhythmias, hypertension, congestive heart failure, stroke and diabetes. Untreated obstructive sleep apnea causes sleep disruption, nonrestorative sleep, and sleep deprivation. This can have an impact on your day to day functioning and cause daytime sleepiness and impairment of cognitive function, memory loss, mood disturbance, and problems focussing. Using CPAP regularly can improve these symptoms.  Keep up the good work! I will see you back in 6 months for sleep apnea check up, and if you continue to do well on CPAP I will see you once a year thereafter.   We will increase your current pressure from 7 cm to 8 cm due to interim weight gain and to help with residual snoring.

## 2016-04-21 NOTE — Progress Notes (Signed)
Subjective:    Patient ID: Jeffery Soto is a 30 y.o. male.  HPI     Interim history:   [name] is a 30 year old right-handed gentleman with an underlying medical history of allergies, previous oral paresthesias, hypothyroidism, and obesity, who presents for follow up consultation of his OSA on CPAP therapy. The patient is unaccompanied today. I first met him on 05/30/15, at which time we talked about his 2 sleep studies and his compliance and his symptoms. He indicated that he was feeling better since starting CPAP therapy. He was compliant with treatment in the first 90 days but then compliance was a little less than 70% and the most recent month. He was reminded to be fully compliant with treatment to get the full benefit of CPAP therapy.  Today, 04/21/2016: I reviewed his CPAP compliance data from 03/18/2016 through 04/16/2016 which is a total of 30 days, during which time he used his machine every night with percent used days greater than 4 hours at 90%, indicating excellent compliance with an average usage of 6 hours and 17 minutes, residual AHI low at 0.5 per hour, leak low with the 95th percentile at 0.2 L/m on a pressure of 7 cm with EPR of 2.  Today, 04/21/2016: He reports Full compliance with CPAP but he has gained weight in the interim. In October 2017 he broke his left foot and needed to be in a boot for about 6 weeks. He had decrease in exercise and has overall gained almost 40 pounds over the past 2 years. He is motivated to lose weight. His blood pressure has increased a little bit most likely secondary to weight gain over time. He was on testosterone replacement but is going to follow-up with his primary care physician about the ongoing need for hormone replacement. He was placed on hormone therapy as they were trying to conceive. He has to skip CPAP when he has increase in allergy symptoms and congestion. He currently has a cold and has not used CPAP in the past 3 days. He will  try again today. His wife has noted interim increase in snoring. He likes using the nasal mask. He had recent replacement of supplies.  Previously:  05/30/2015: He had a baseline sleep study in July 2015 and a CPAP titration study in September 2015. He did not make a follow-up appointment after his sleep studies fo discussion of his sleep studies and management of his OSA. This is our first visit today. I talked to him about his sleep test results. His baseline sleep study from 11/29/2013 showed a sleep efficiency of 87.8% with a sleep latency of 10 minutes and wake after sleep onset of 41 minutes with mild sleep fragmentation noted. He had normal percentages for light stage sleep, a normal percentage of slow-wave sleep and a new normal percentage of REM sleep with a mildly prolonged REM latency. He had no significant PLMS, EKG or EEG changes. Had moderate to loud snoring. Total AHI was 5.9 per hour, rising to 23.8 per hour during REM sleep. Average oxygen saturation was 94%, nadir was 85%.  Based on his test results I invited him back for a titration study.  He had this on 01/24/2014. Sleep efficiency was 93.5%, sleep latency was 9 minutes, wake after sleep onset 18.5 minutes with mild sleep fragmentation noted. He had a normal arousal index. He had an increased percentage of stage II sleep, a mildly decreased percentage of slow-wave sleep and a mildly decreased percentage of REM  sleep with a prolonged REM latency. He had no significant PLMS, EKG or EEG changes. Average oxygen saturation was 94%, nadir was 88%. CPAP was titrated from 5 cm to 7 cm. AHI was 0 per hour on the final pressure. Supine REM sleep was achieved.  Based on his test results I prescribed CPAP therapy for home use. I previously reviewed his CPAP compliance data from 02/20/2014 through 03/21/2014 which is a total of 30 days during which time he used his machine every night with percent used days greater than 4 hours at 83%, indicating very  good compliance with an average usage of 5 hours and 54 minutes, residual AHI low at 0.7 per hour, leaked low with the 95th percentile at 4.4 L/m on a pressure of 7 cm with EPR of 2. I reviewed his CPAP compliance data from 04/29/2015 through 05/28/2015 which is a total of 30 days during which time he used his machine 24 days with percent used days greater than 4 hours at 63%, indicating suboptimal compliance with an average usage of 4 hours and 39 minutes, residual AHI low at 0.3 per hour, leak low with the 95th percentile at 0.1 L/m on a pressure of 7 cm with EPR of 2. Looking at his last 90 day compliance data, he was 73% compliant. 05/30/2015: He reports feeling much better with regards to his sleep since he started CPAP therapy in 2015. He takes care of his machine in his mask. He needs supplies however. He still has the original mask and supplies. His DME company is [facility]. He is trying to lose weight but did gain some. He now has a treadmill and uses this at least 2 or 3 times per week. His oral paresthesias have eventually resolved. He was using gabapentin for some time and was abstaining from alcohol as it was a trigger of his oral/gum pain after tooth surgery. He got married in October. He goes to bed around 9 or 10 PM. Rise time is between 4:30 and 6 AM. His wife is a paramedic and he tries to adhere to her sleep schedule. He does not watch TV in bed. He likes to read some. 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 He drinks alcohol occasionally and resumed occasional alcohol consumption after October 2016. He is a nonsmoker. He drinks quite a bit of coffee, about 20 oz or little more per day. He does not drink sodas typically. He denies restless leg symptoms, morning headaches or nocturia. He feels that he wakes up better rested when he uses CPAP. The recent lapse in treatment was secondary to a sinus infection. He does get tonsillitis about 2 or 3 times per  year.  His Past Medical History Is Significant For: Past Medical History:  Diagnosis Date  . Asthma   . High cholesterol   . Hyperthyroidism     His Past Surgical History Is Significant For: Past Surgical History:  Procedure Laterality Date  . MOUTH SURGERY  2014  . None      His Family History Is Significant For: Family History  Problem Relation Age of Onset  . Heart attack Paternal Grandfather   . Irritable bowel syndrome Mother   . Arthritis Mother     His Social History Is Significant For: Social History   Social History  . Marital status: Married    Spouse name: N/A  . Number of children: 0  . Years of education: Masters  Social History Main Topics  . Smoking status: Former Smoker    Quit date: 07/02/2013  . Smokeless tobacco: Never Used  . Alcohol use Yes     Comment: 2 times a week   . Drug use: No  . Sexual activity: Not Asked   Other Topics Concern  . None   Social History Narrative   Patient lives at home home with girlfriend Velna Hatchet.   Patient has no children.    Masters in Press photographer    Patient is not married.    Patient is left handed.    Patient is currently working full time.     His Allergies Are:  Allergies  Allergen Reactions  . Amoxicillin Swelling  . Penicillins   :   His Current Medications Are:  Outpatient Encounter Prescriptions as of 04/21/2016  Medication Sig  . Fexofenadine HCl (ALLEGRA PO) Take by mouth as needed.  Marland Kitchen levothyroxine (SYNTHROID, LEVOTHROID) 75 MCG tablet Take 1 tablet (75 mcg total) by mouth daily. Follow up for repeat testing in 2 months  . Multiple Vitamin (THERA) TABS Take by mouth.   No facility-administered encounter medications on file as of 04/21/2016.   :  Review of Systems:  Out of a complete 14 point review of systems, all are reviewed and negative with the exception of these symptoms as listed below:  Review of Systems  Neurological:       Pt presents today for a cpap follow up. Pt has had a  cold for the past few days and has been unable to use his cpap since Sunday. Pt is using [facility] with no problems.    Objective:  Neurologic Exam  Physical Exam Physical Examination:   Vitals:   04/21/16 0843  BP: (!) 151/89  Pulse: 94  Resp: 20    General Examination: The patient is a very pleasant 30 y.o. male in no acute distress. He appears well-developed and well-nourished and very well groomed. He is obese with interim weight gain.  HEENT: Normocephalic, atraumatic, pupils are equal, round and reactive to light and accommodation. Extraocular tracking is good without limitation to gaze excursion or nystagmus noted. Normal smooth pursuit is noted. Hearing is grossly intact. Face is symmetric with normal facial animation and normal facial sensation. Speech is clear with no dysarthria noted. There is no hypophonia. There is no lip, neck/head, jaw or voice tremor. Neck is supple with full range of passive and active motion. There are no carotid bruits on auscultation. Oropharynx exam reveals: mild mouth dryness, adequate dental hygiene and moderate airway crowding, due to tonsils of 1-2+ bilaterally, and narrow airway entry. Pharynx is mildly erythematous appearing - has a cold currently, tonsils are cryptic in appearance. Mallampati is class I. Tongue protrudes centrally and palate elevates symmetrically.   Chest: Clear to auscultation without wheezing, rhonchi or crackles noted.  Heart: S1+S2+0, regular and normal without murmurs, rubs or gallops noted.   Abdomen: Soft, non-tender and non-distended with normal bowel sounds appreciated on auscultation.  Extremities: There is no pitting edema in the distal lower extremities bilaterally. Pedal pulses are intact.  Skin: Warm and dry without trophic changes noted.   Musculoskeletal: exam reveals no obvious joint deformities, tenderness or joint swelling or erythema.   Neurologically:  Mental status: The patient is awake, alert and oriented  in all 4 spheres. His immediate and remote memory, attention, language skills and fund of knowledge are appropriate. There is no evidence of aphasia, agnosia, apraxia or anomia. Speech is clear with  normal prosody and enunciation. Thought process is linear. Mood is normal and affect is normal.  Cranial nerves II - XII are as described above under HEENT exam. In addition: shoulder shrug is normal with equal shoulder height noted. Motor exam: Normal bulk, strength and tone is noted. There is no drift, tremor or rebound. Romberg is negative. Reflexes are 2+ throughout. Fine motor skills and coordination: intact with normal finger taps, normal hand movements, normal rapid alternating patting, normal foot taps and normal foot agility.  Cerebellar testing: No dysmetria or intention tremor on finger to nose testing. Heel to shin is unremarkable bilaterally. There is no truncal or gait ataxia.  Sensory exam: intact to light touch, pinprick, vibration, temperature sense in the upper and lower extremities, with the exception that he reports his right big toe is numb since he had a minor injury to it. He was told that he had a bone spur.  Gait, station and balance: He stands easily. No veering to one side is noted. No leaning to one side is noted. Posture is age-appropriate and stance is narrow based. Gait shows normal stride length and normal pace. No problems turning are noted.   Assessment and Plan:   In summary, DALANTE MINUS is a very pleasant 30 year old male with an underlying medical history of allergies, previous history of paresthesias, hypothyroidism, and obesity, who presents for follow-up consultation of his obstructive sleep apnea, on CPAP therapy. He reports good results with CPAP and is fully compliant with treatment with the exception of skipping days when he has a cold or congestion or flareup of allergy symptoms. He takes over-the-counter allergy medication on nearly a daily basis. He suspects  that he is allergic to their cat. He had sleep study testing in 2015 and we previously reviewed his test results in detail. He is commended for his treatment adherence. Unfortunately, he has gained weight in the interim. He is motivated to work on weight loss. He has noted some interim increase in snoring. I suggested we try increasing his CPAP pressure for residual or recurrence of snoring and interim weight gain to 8 cm. He is agreeable. I renewed his supply order and also placed an order for increase in CPAP pressure to 8 cm.  From my end of things he is doing well. He is advised to follow-up on a yearly basis. I answered all his questions today and the patient was in agreement with the plan.   I spent 25 minutes in total face-to-face time with the patient, more than 50% of which was spent in counseling and coordination of care, reviewing test results, reviewing medication and discussing or reviewing the diagnosis of OSA, its prognosis and treatment options.

## 2016-05-08 ENCOUNTER — Ambulatory Visit (INDEPENDENT_AMBULATORY_CARE_PROVIDER_SITE_OTHER): Payer: 59 | Admitting: Orthopaedic Surgery

## 2016-05-08 ENCOUNTER — Encounter (INDEPENDENT_AMBULATORY_CARE_PROVIDER_SITE_OTHER): Payer: Self-pay | Admitting: Orthopaedic Surgery

## 2016-05-08 DIAGNOSIS — S92812D Other fracture of left foot, subsequent encounter for fracture with routine healing: Secondary | ICD-10-CM | POA: Diagnosis not present

## 2016-05-08 DIAGNOSIS — S92812A Other fracture of left foot, initial encounter for closed fracture: Secondary | ICD-10-CM | POA: Insufficient documentation

## 2016-05-11 NOTE — Progress Notes (Signed)
   Office Visit Note   Patient: Jeffery Soto N Knack           Date of Birth: 1985-07-23           MRN: [id number] Visit Date: 05/08/2016              Requested by: Alysia PennaScott Holwerda, MD [address] [place], [place] [address] PCP: Alysia PennaHOLWERDA, SCOTT, MD   Assessment & Plan: Visit Diagnoses:  1. Closed fracture of sesamoid bone of left foot with routine healing, subsequent encounter     Plan: I think given his clinical exam which is mildly symptomatic I would recommend going back into a wooden shoe for 2 weeks and then increase his activity as tolerated and wean the wound shoe into regular shoes. If this fails to provide him with relief then I would consider MRI to assess healing of the sesamoid fracture and possible turf toe. Otherwise I'll see him back as needed.  Follow-Up Instructions: Return if symptoms worsen or fail to improve.   Orders:  No orders of the defined types were placed in this encounter.  No orders of the defined types were placed in this encounter.     Procedures: No procedures performed   Clinical Data: No additional findings.   Subjective: Chief Complaint  Patient presents with  . Left Foot - Pain    Patient comes back today for recent worsening of left great toe pain underneath the sesamoid that's worse with flexion of his great toe and radiates into the arch of this foot. He denies any new injuries or swelling or bruising. He says that he's been very careful with his activity.    Review of Systems Negative except for history of present illness  Objective: Vital Signs: There were no vitals taken for this visit.  Physical Exam Well-developed well-nourished neck distress alert 3 Ortho Exam Exam of the left foot shows no bruising or swelling on the plantar surface. He is mildly tender to palpation under the lateral sesamoid. He has good flexion and extension strength. Web spaces nontender. Specialty Comments:  No specialty comments  available.  Imaging: No results found.   PMFS History: Patient Active Problem List   Diagnosis Date Noted  . Closed fracture of sesamoid bone of left foot 05/08/2016  . Paresthesia 09/27/2013  . Obstructive sleep apnea 09/27/2013   Past Medical History:  Diagnosis Date  . Asthma   . High cholesterol   . Hyperthyroidism     Family History  Problem Relation Age of Onset  . Heart attack Paternal Grandfather   . Irritable bowel syndrome Mother   . Arthritis Mother     Past Surgical History:  Procedure Laterality Date  . MOUTH SURGERY  2014  . None     Social History   Occupational History  . Not on file.   Social History Main Topics  . Smoking status: Former Smoker    Quit date: 07/02/2013  . Smokeless tobacco: Never Used  . Alcohol use Yes     Comment: 2 times a week   . Drug use: No  . Sexual activity: Not on file

## 2016-05-13 ENCOUNTER — Other Ambulatory Visit (INDEPENDENT_AMBULATORY_CARE_PROVIDER_SITE_OTHER): Payer: Self-pay

## 2016-06-15 ENCOUNTER — Ambulatory Visit (INDEPENDENT_AMBULATORY_CARE_PROVIDER_SITE_OTHER): Payer: 59 | Admitting: Orthopaedic Surgery

## 2016-06-16 ENCOUNTER — Encounter (INDEPENDENT_AMBULATORY_CARE_PROVIDER_SITE_OTHER): Payer: Self-pay | Admitting: Orthopaedic Surgery

## 2016-06-16 ENCOUNTER — Ambulatory Visit (INDEPENDENT_AMBULATORY_CARE_PROVIDER_SITE_OTHER): Payer: 59

## 2016-06-16 ENCOUNTER — Ambulatory Visit (INDEPENDENT_AMBULATORY_CARE_PROVIDER_SITE_OTHER): Payer: 59 | Admitting: Orthopaedic Surgery

## 2016-06-16 DIAGNOSIS — S92812D Other fracture of left foot, subsequent encounter for fracture with routine healing: Secondary | ICD-10-CM

## 2016-06-16 NOTE — Progress Notes (Signed)
Office Visit Note   Patient: Jeffery Soto           Date of Birth: Jan 03, 1986           MRN: [id number] Visit Date: 06/16/2016              Requested by: Alysia PennaScott Holwerda, MD [address] [place], [place] [address] PCP: Alysia PennaHOLWERDA, SCOTT, MD   Assessment & Plan: Visit Diagnoses:  1. 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 Home exercises were provided. I demonstrated some of these exercises to him. I'll see him back as needed.  Follow-Up Instructions: Return if symptoms worsen or fail to improve.   Orders:  Orders Placed This Encounter  Procedures  . XR Foot Complete Right  . XR Foot Complete Left   No orders of the defined types were placed in this encounter.     Procedures: No procedures performed   Clinical Data: No additional findings.   Subjective: Chief Complaint  Patient presents with  . Left Foot - Pain  . Right Foot - Pain    Patient follows up today for bilateral foot pain. He is endorsing pain mainly in the arch of his feet. He's been wearing over-the-counter orthotics. He really doesn't have much pain over the sesamoids anymore. Denies any injuries. He is traveling a lot. He did wear cam boot temporarily for relief. Any new injuries.    Review of Systems Complete review of systems is negative except for history of present illness  Objective: Vital Signs: There were no vitals taken for this visit.  Physical Exam  Constitutional: He is oriented to person, place, and time. He appears well-developed and well-nourished.  Pulmonary/Chest: Effort normal.  Abdominal: Soft.  Neurological: He is alert and oriented to person, place, and time.  Skin: Skin is warm.  Psychiatric: He has a normal mood and affect. His behavior is normal. Judgment and thought content normal.  Nursing note and vitals reviewed.   Ortho Exam Exam of  bilateral feet shows no swelling or focal findings. He has nonspecific discomfort in the arch of his foot with palpation. Sesamoids nontender. Bilateral Achilles contractures Specialty Comments:  No specialty comments available.  Imaging: Xr Foot Complete Left  Result Date: 06/16/2016 Resorption of the sesamoid fracture. No acute findings.  Xr Foot Complete Right  Result Date: 06/16/2016 No acute findings.    PMFS History: Patient Active Problem List   Diagnosis Date Noted  . Closed fracture of sesamoid bone of left foot 05/08/2016  . Paresthesia 09/27/2013  . Obstructive sleep apnea 09/27/2013   Past Medical History:  Diagnosis Date  . Asthma   . High cholesterol   . Hyperthyroidism     Family History  Problem Relation Age of Onset  . Heart attack Paternal Grandfather   . Irritable bowel syndrome Mother   . Arthritis Mother     Past Surgical History:  Procedure Laterality Date  . MOUTH SURGERY  2014  . None     Social History   Occupational History  . Not on file.   Social History Main Topics  . Smoking status: Former Smoker    Quit date: 07/02/2013  . Smokeless tobacco: Never Used  . Alcohol use Yes     Comment: 2 times a week   . Drug use: No  . Sexual activity: Not on file

## 2016-10-20 IMAGING — MR MR HEAD WO/W CM
18 series · 48 of 48 positions shown · IV contrast (multihance)
Comparison: None.

CLINICAL DATA: Hypogonadism.  Decreased libido.

EXAM:
MRI HEAD WITHOUT AND WITH CONTRAST
TECHNIQUE: Multiplanar, multiecho pulse sequences of the brain and surrounding
structures were obtained without and with intravenous contrast.
CONTRAST:  10mL MULTIHANCE GADOBENATE DIMEGLUMINE 529 MG/ML IV SOLN

[Series 5: T1 · sagittal · 4.0mm · 0.72mm/px · 2 of 29 slices shown (1 of 5)]
[im 1/29]
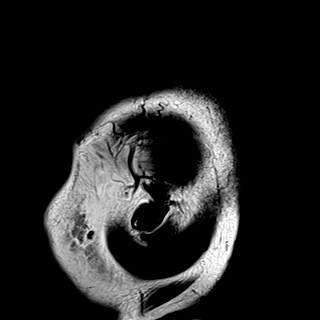
[im 29/29]
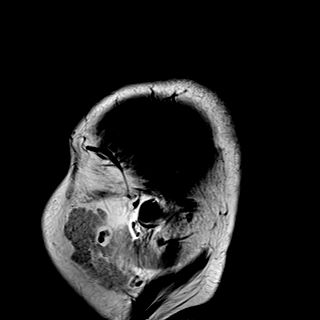

[Series 6: DWI · axial · 4.0mm · 1.44mm/px · z∈[-82,+67]mm · 6 of 60 slices shown (1 of 2)]
[im 1/60]
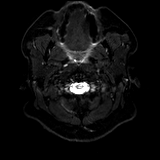
[im 12/60]
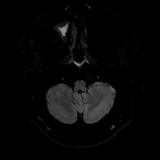
[im 24/60]
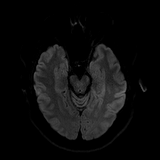
[im 36/60]
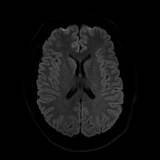
[im 48/60]
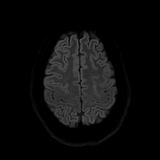
[im 60/60]
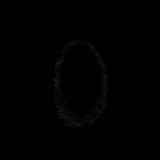

[Series 7: DWI · axial · 4.0mm · 1.44mm/px · z∈[-82,+67]mm · 3 of 30 slices shown (2 of 2)]
[im 1/30]
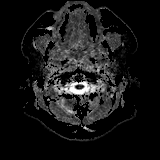
[im 15/30]
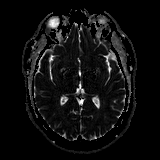
[im 30/30]
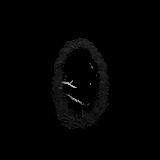

[Series 8: T2 · axial · 4.0mm · 0.37mm/px · z∈[-75,+74]mm · 3 of 30 slices shown]
[im 1/30]
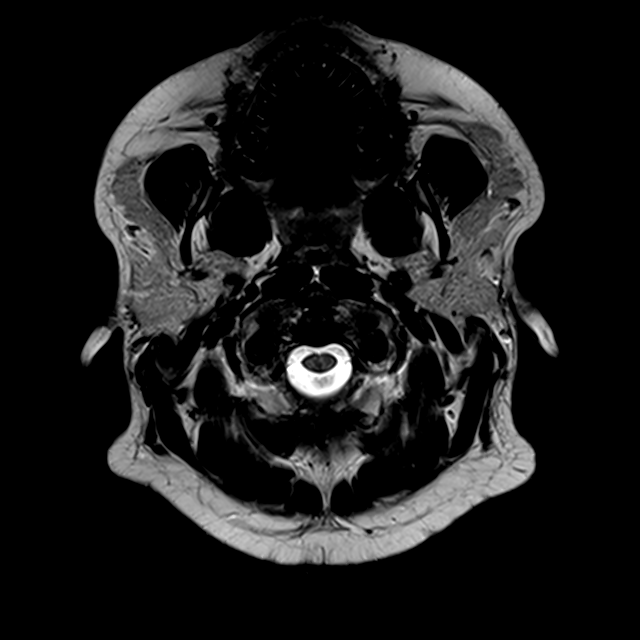
[im 15/30]
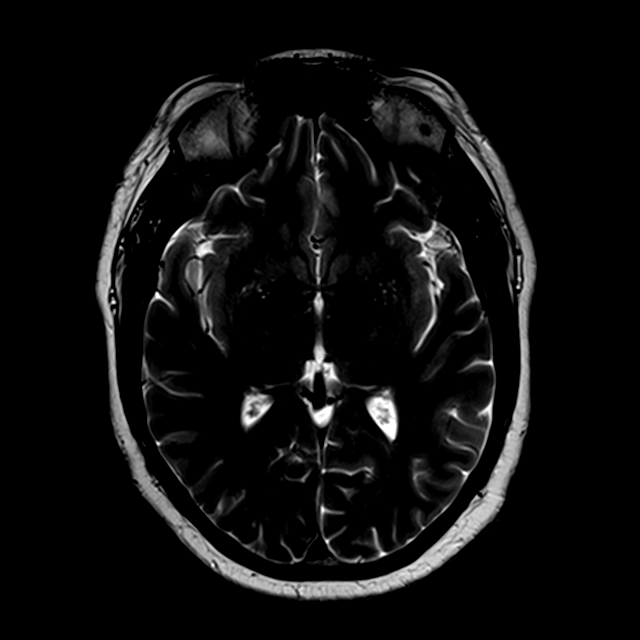
[im 30/30]
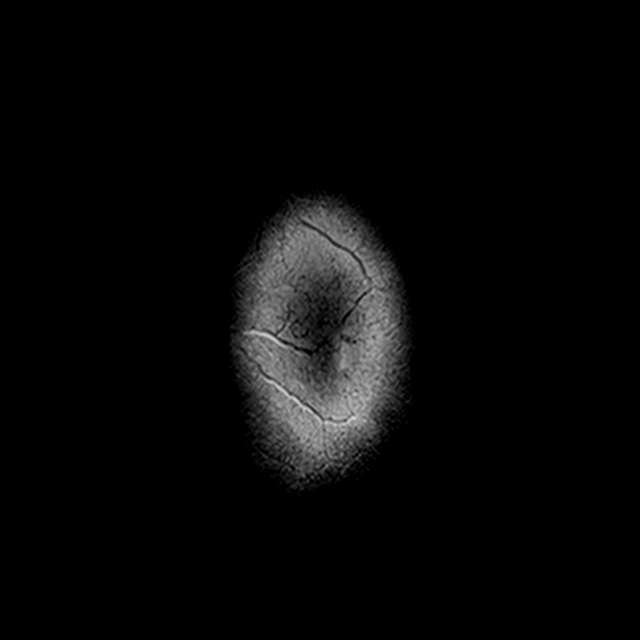

[Series 9: GRE · axial · 4.0mm · 0.73mm/px · z∈[-75,+74]mm · 3 of 30 slices shown]
[im 1/30]
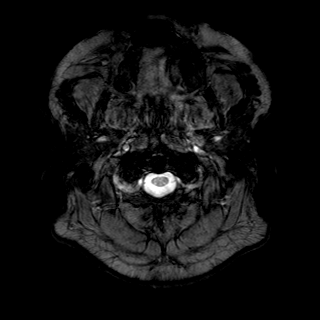
[im 15/30]
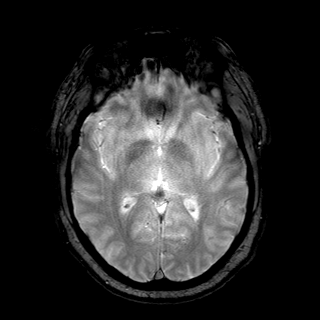
[im 30/30]
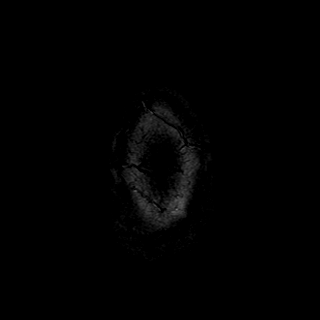

[Series 10: FLAIR · axial · 4.0mm · 0.73mm/px · z∈[-76,+74]mm · 3 of 30 slices shown]
[im 1/30]
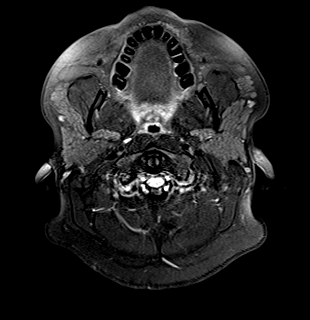
[im 15/30]
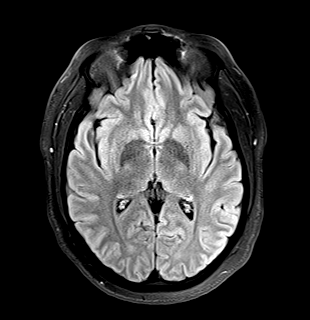
[im 30/30]
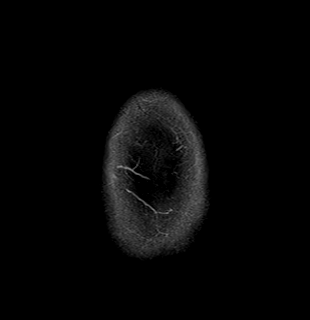

[Series 11: T1 · coronal · 3.0mm · 0.42mm/px · 1 of 10 slices shown (2 of 5)]
[im 1/10]
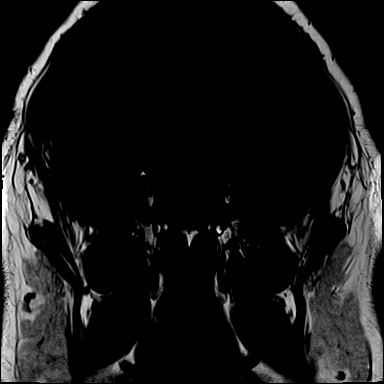

[Series 12: T1 · sagittal · 3.0mm · 0.42mm/px · 1 of 13 slices shown (3 of 5)]
[im 1/13]
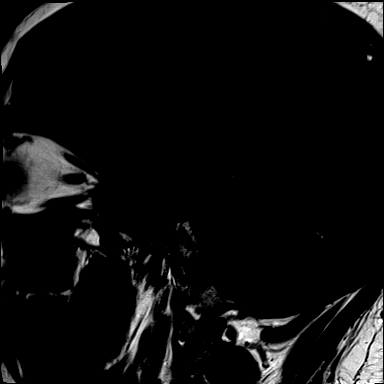

[Series 13: T1 · coronal · non-contrast · 3.0mm · 0.66mm/px · 1 of 7 slices shown (4 of 5)]
[im 1/7]
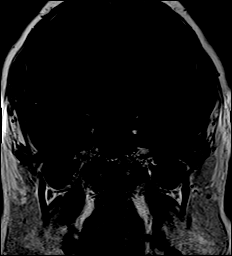

[Series 14: cor post dyn · coronal · 3.0mm · 0.66mm/px · 1 of 7 slices shown (1 of 6)]
[im 1/7]
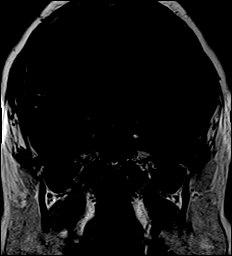

[Series 15: cor post dyn · coronal · 3.0mm · 0.66mm/px · 1 of 7 slices shown (2 of 6)]
[im 1/7]
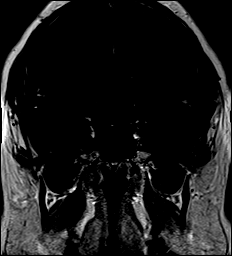

[Series 16: cor post dyn · coronal · 3.0mm · 0.66mm/px · 1 of 7 slices shown (3 of 6)]
[im 1/7]
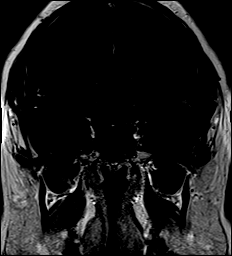

[Series 17: cor post dyn · coronal · 3.0mm · 0.66mm/px · 1 of 7 slices shown (4 of 6)]
[im 1/7]
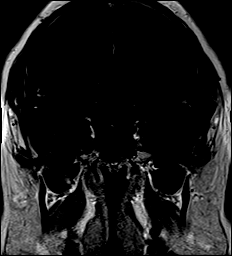

[Series 18: cor post dyn · coronal · 3.0mm · 0.66mm/px · 1 of 7 slices shown (5 of 6)]
[im 1/7]
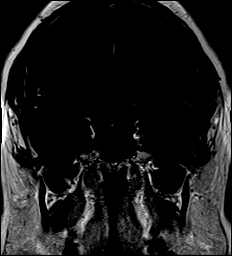

[Series 19: cor post dyn · coronal · 3.0mm · 0.66mm/px · 1 of 7 slices shown (6 of 6)]
[im 1/7]
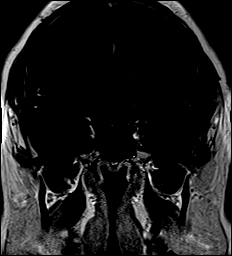

[Series 20: T1 post-contrast · sagittal · 3.0mm · 0.42mm/px · 1 of 13 slices shown (1 of 2)]
[im 1/13]
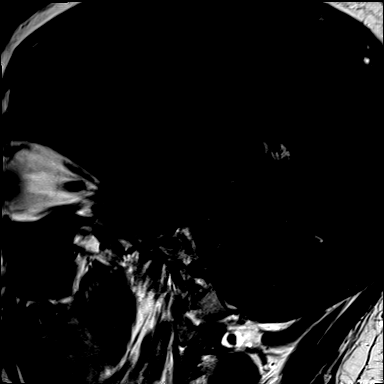

[Series 21: T1 post-contrast · coronal · 3.0mm · 0.42mm/px · 1 of 10 slices shown (2 of 2)]
[im 1/10]
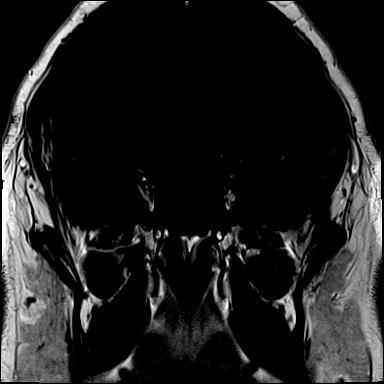

[Series 22: T1 · axial · 1.0mm · 0.92mm/px · z∈[-88,+82]mm · 17 of 172 slices shown (5 of 5)]
[im 1/172]
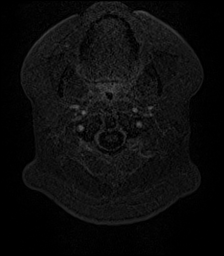
[im 11/172]
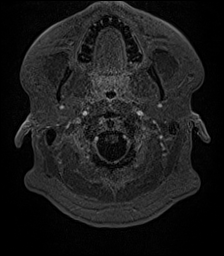
[im 22/172]
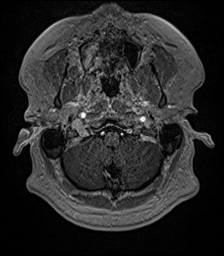
[im 33/172]
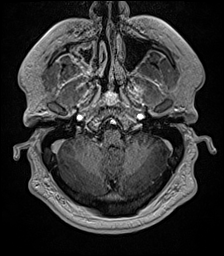
[im 43/172]
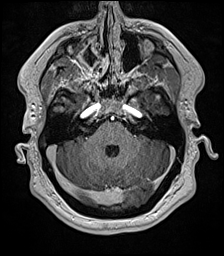
[im 54/172]
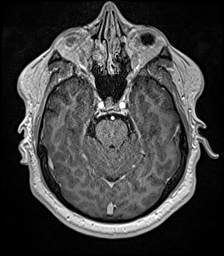
[im 65/172]
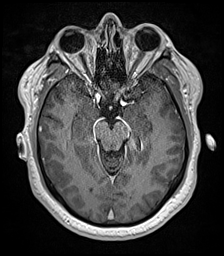
[im 75/172]
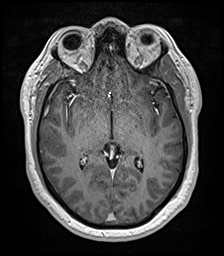
[im 86/172]
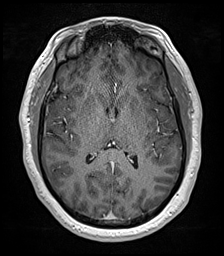
[im 97/172]
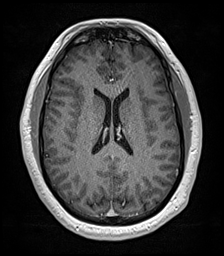
[im 107/172]
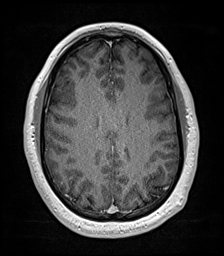
[im 118/172]
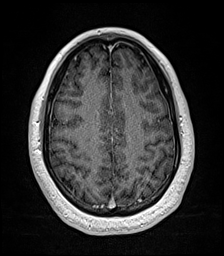
[im 129/172]
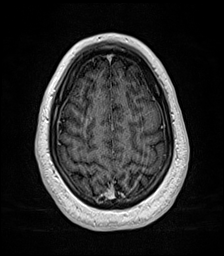
[im 139/172]
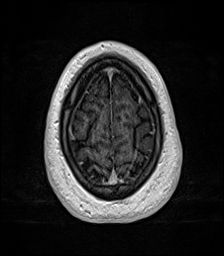
[im 150/172]
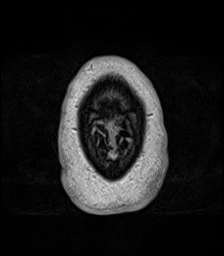
[im 161/172]
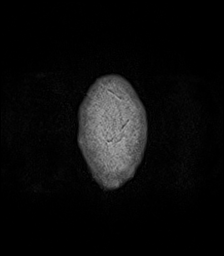
[im 172/172]
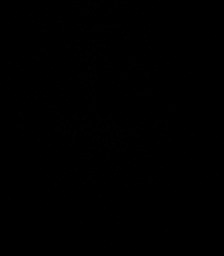

[48 of 48 positions shown; findings below may reference images not displayed]

FINDINGS: Pituitary protocol performed with thin sections and dynamic imaging
through the sella

Pituitary normal in size measuring approximately 3.5 mm in height.
Pituitary enhances homogeneously. Negative for pituitary
microadenoma. Infundibulum midline. No compression of the optic
chiasm. Cavernous sinus normal bilaterally.

Ventricle size normal.  Cerebral volume normal.

Negative for acute or chronic infarction. Negative for demyelinating
disease. No intracranial mass lesion. Normal enhancement of the
brain following contrast infusion.

Mucosal edema and retained secretions filling the right maxillary
antrum extending into the right ethmoid sinus. Normal skull base.
Mastoid sinus clear bilaterally.
IMPRESSION: Normal pituitary

Normal brain

Mucosal edema and retained secretions filling the right maxillary
sinus extending into the right ethmoid sinus.

## 2016-10-29 ENCOUNTER — Ambulatory Visit (INDEPENDENT_AMBULATORY_CARE_PROVIDER_SITE_OTHER): Payer: 59 | Admitting: Orthopaedic Surgery

## 2016-10-29 ENCOUNTER — Ambulatory Visit (INDEPENDENT_AMBULATORY_CARE_PROVIDER_SITE_OTHER): Payer: 59

## 2016-10-29 DIAGNOSIS — M25572 Pain in left ankle and joints of left foot: Secondary | ICD-10-CM | POA: Diagnosis not present

## 2016-10-29 NOTE — Progress Notes (Signed)
   Office Visit Note   Patient: Jeffery Soto N Fennelly           Date of Birth: 10-Aug-1985           MRN: [id number] Visit Date: 10/29/2016              Requested by: Alysia PennaHolwerda, Scott, MD [address] [place], [place] [id number] PCP: Alysia PennaHolwerda, Scott, MD   Assessment & Plan: Visit Diagnoses:  1. Pain in left ankle and joints of left foot     Plan: Overall impression left plantar fascial strain. He does not have enough pain to warrant a Cam Walker. He'll continue to wear regular shoes and shoe inserts. Recommend NSAIDs and ice and stretching. Follow up with me as needed.  Follow-Up Instructions: Return if symptoms worsen or fail to improve.   Orders:  Orders Placed This Encounter  Procedures  . XR Foot Complete Left   No orders of the defined types were placed in this encounter.     Procedures: No procedures performed   Clinical Data: No additional findings.   Subjective: No chief complaint on file.   Jeffery Soto comes back today for left foot pain in the mandible. He felt a pull about couple weeks ago when he was mowing. Denies any swelling or any bruising. He has been rolling tennis ball in his foot. Due to walking with regular shoes and an insert.    Review of Systems  Constitutional: Negative.   All other systems reviewed and are negative.    Objective: Vital Signs: There were no vitals taken for this visit.  Physical Exam  Constitutional: He is oriented to person, place, and time. He appears well-developed and well-nourished.  Pulmonary/Chest: Effort normal.  Abdominal: Soft.  Neurological: He is alert and oriented to person, place, and time.  Skin: Skin is warm.  Psychiatric: He has a normal mood and affect. His behavior is normal. Judgment and thought content normal.  Nursing note and vitals reviewed.   Ortho Exam Left foot exam shows no swelling or bruising. He has mild tenderness in the mid arch. Specialty Comments:  No specialty comments  available.  Imaging: Xr Foot Complete Left  Result Date: 10/29/2016 No acute or structural abnormalities    PMFS History: Patient Active Problem List   Diagnosis Date Noted  . Closed fracture of sesamoid bone of left foot 05/08/2016  . Paresthesia 09/27/2013  . Obstructive sleep apnea 09/27/2013   Past Medical History:  Diagnosis Date  . Asthma   . High cholesterol   . Hyperthyroidism     Family History  Problem Relation Age of Onset  . Heart attack Paternal Grandfather   . Irritable bowel syndrome Mother   . Arthritis Mother     Past Surgical History:  Procedure Laterality Date  . MOUTH SURGERY  2014  . None     Social History   Occupational History  . Not on file.   Social History Main Topics  . Smoking status: Former Smoker    Quit date: 07/02/2013  . Smokeless tobacco: Never Used  . Alcohol use Yes     Comment: 2 times a week   . Drug use: No  . Sexual activity: Not on file

## 2017-04-20 ENCOUNTER — Encounter: Payer: Self-pay | Admitting: Neurology

## 2017-04-22 ENCOUNTER — Ambulatory Visit (INDEPENDENT_AMBULATORY_CARE_PROVIDER_SITE_OTHER): Payer: BLUE CROSS/BLUE SHIELD | Admitting: Neurology

## 2017-04-22 ENCOUNTER — Encounter: Payer: Self-pay | Admitting: Neurology

## 2017-04-22 VITALS — BP 188/86 | HR 99 | Ht 71.0 in | Wt 288.0 lb

## 2017-04-22 DIAGNOSIS — Z9989 Dependence on other enabling machines and devices: Secondary | ICD-10-CM

## 2017-04-22 DIAGNOSIS — G4733 Obstructive sleep apnea (adult) (pediatric): Secondary | ICD-10-CM | POA: Diagnosis not present

## 2017-04-22 NOTE — Progress Notes (Signed)
Subjective:    Patient ID: Jeffery Soto is a 31 y.o. male.  HPI     Interim history:   Jeffery Soto is a 31 year old right-handed gentleman with an underlying medical history of allergies, previous oral paresthesias, hypothyroidism, and obesity, who presents for follow up consultation of his OSA on CPAP therapy. The patient is unaccompanied today. I last saw him on 04/21/2016, at which time he was compliant with CPAP. He had gained some weight and noted some increase in snoring. I increased his CPAP pressure to 8 cm. He had some blood pressure increased as well. He was advised to routinely follow-up in one year as he was stable otherwise.  Today, 04/22/2017: I reviewed his CPAP compliance data from 03/22/2017 through 04/20/2017 which is a total of 30 days, during which time he used his CPAP every night with percent used days greater than 4 hours at 97%, indicating excellent compliance with an average usage of 6 hours and 45 minutes, residual AHI at goal at 0.5 per hour, leak low with the 95th percentile at 3.9 L/m on a pressure of 8 cm with EPR of 2. He reports doing well, no recent medical issues or problems with his CPAP. His DME company is [facility].  The patient's allergies, current medications, family history, past medical history, past social history, past surgical history and problem list were reviewed and updated as appropriate.    Previously:  I first met him on 05/30/15, at which time we talked about his 2 sleep studies and his compliance and his symptoms. He indicated that he was feeling better since starting CPAP therapy. He was compliant with treatment in the first 90 days but then compliance was a little less than 70% and the most recent month. He was reminded to be fully compliant with treatment to get the full benefit of CPAP therapy.   I reviewed his CPAP compliance data from 03/18/2016 through 04/16/2016 which is a total of 30 days, during which time he used his  machine every night with percent used days greater than 4 hours at 90%, indicating excellent compliance with an average usage of 6 hours and 17 minutes, residual AHI low at 0.5 per hour, leak low with the 95th percentile at 0.2 L/m on a pressure of 7 cm with EPR of 2.   05/30/2015: He had a baseline sleep study in July 2015 and a CPAP titration study in September 2015. He did not make a follow-up appointment after his sleep studies fo discussion of his sleep studies and management of his OSA. This is our first visit today. I talked to him about his sleep test results. His baseline sleep study from 11/29/2013 showed a sleep efficiency of 87.8% with a sleep latency of 10 minutes and wake after sleep onset of 41 minutes with mild sleep fragmentation noted. He had normal percentages for light stage sleep, a normal percentage of slow-wave sleep and a new normal percentage of REM sleep with a mildly prolonged REM latency. He had no significant PLMS, EKG or EEG changes. Had moderate to loud snoring. Total AHI was 5.9 per hour, rising to 23.8 per hour during REM sleep. Average oxygen saturation was 94%, nadir was 85%.  Based on his test results I invited him back for a titration study.  He had this on 01/24/2014. Sleep efficiency was 93.5%, sleep latency was 9 minutes, wake after sleep onset 18.5 minutes with mild sleep fragmentation noted. He had a normal arousal index. He had an  increased percentage of stage II sleep, a mildly decreased percentage of slow-wave sleep and a mildly decreased percentage of REM sleep with a prolonged REM latency. He had no significant PLMS, EKG or EEG changes. Average oxygen saturation was 94%, nadir was 88%. CPAP was titrated from 5 cm to 7 cm. AHI was 0 per hour on the final pressure. Supine REM sleep was achieved.  Based on his test results I prescribed CPAP therapy for home use. I previously reviewed his CPAP compliance data from 02/20/2014 through 03/21/2014 which is a total of 30  days during which time he used his machine every night with percent used days greater than 4 hours at 83%, indicating very good compliance with an average usage of 5 hours and 54 minutes, residual AHI low at 0.7 per hour, leaked low with the 95th percentile at 4.4 L/m on a pressure of 7 cm with EPR of 2. I reviewed his CPAP compliance data from 04/29/2015 through 05/28/2015 which is a total of 30 days during which time he used his machine 24 days with percent used days greater than 4 hours at 63%, indicating suboptimal compliance with an average usage of 4 hours and 39 minutes, residual AHI low at 0.3 per hour, leak low with the 95th percentile at 0.1 L/m on a pressure of 7 cm with EPR of 2. Looking at his last 90 day compliance data, he was 73% compliant. 05/30/2015: He reports feeling much better with regards to his sleep since he started CPAP therapy in 2015. He takes care of his machine in his mask. He needs supplies however. He still has the original mask and supplies. His DME company is [facility]. He is trying to lose weight but did gain some. He now has a treadmill and uses this at least 2 or 3 times per week. His oral paresthesias have eventually resolved. He was using gabapentin for some time and was abstaining from alcohol as it was a trigger of his oral/gum pain after tooth surgery. He got married in October. He goes to bed around 9 or 10 PM. Rise time is between 4:30 and 6 AM. His wife is a paramedic and he tries to adhere to her sleep schedule. He does not watch TV in bed. He likes to read some. 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 He drinks alcohol occasionally and resumed occasional alcohol consumption after October 2016. He is a nonsmoker. He drinks quite a bit of coffee, about 20 oz or little more per day. He does not drink sodas typically. He denies restless leg symptoms, morning headaches or nocturia. He feels that he wakes up better rested when he uses CPAP.  The recent lapse in treatment was secondary to a sinus infection. He does get tonsillitis about 2 or 3 times per year.  His Past Medical History Is Significant For: Past Medical History:  Diagnosis Date  . Asthma   . High cholesterol   . Hyperthyroidism     His Past Surgical History Is Significant For: Past Surgical History:  Procedure Laterality Date  . MOUTH SURGERY  2014  . None      His Family History Is Significant For: Family History  Problem Relation Age of Onset  . Heart attack Paternal Grandfather   . Irritable bowel syndrome Mother   . Arthritis Mother     His Social History Is Significant For: Social History   Socioeconomic History  . Marital status:  Married    Spouse name: None  . Number of children: 0  . Years of education: Masters  . Highest education level: None  Social Needs  . Financial resource strain: None  . Food insecurity - worry: None  . Food insecurity - inability: None  . Transportation needs - medical: None  . Transportation needs - non-medical: None  Occupational History  . None  Tobacco Use  . Smoking status: Former Smoker    Last attempt to quit: 07/02/2013    Years since quitting: 3.8  . Smokeless tobacco: Never Used  Substance and Sexual Activity  . Alcohol use: Yes    Comment: 2 times a week   . Drug use: No  . Sexual activity: None  Other Topics Concern  . None  Social History Narrative   Patient lives at home home with girlfriend Velna Hatchet.   Patient has no children.    Masters in Press photographer    Patient is not married.    Patient is left handed.    Patient is currently working full time.     His Allergies Are:  Allergies  Allergen Reactions  . Amoxicillin Swelling  . Penicillins   :   His Current Medications Are:  Outpatient Encounter Medications as of 04/22/2017  Medication Sig  . albuterol (PROVENTIL HFA;VENTOLIN HFA) 108 (90 Base) MCG/ACT inhaler Inhale into the lungs.  Marland Kitchen Fexofenadine HCl (ALLEGRA PO) Take by  mouth as needed.  Marland Kitchen levothyroxine (SYNTHROID, LEVOTHROID) 75 MCG tablet Take 1 tablet (75 mcg total) by mouth daily. Follow up for repeat testing in 2 months  . [DISCONTINUED] Multiple Vitamin (THERA) TABS Take by mouth.  . [DISCONTINUED] TESTIM 50 MG/5GM (1%) GEL APP TO BOTH ARMS D   No facility-administered encounter medications on file as of 04/22/2017.   :  Review of Systems:  Out of a complete 14 point review of systems, all are reviewed and negative with the exception of these symptoms as listed below: Review of Systems  Neurological:       Pt presents today to discuss his cpap. Pt has no issues with his cpap. Pt uses [facility].    Objective:  Neurological Exam  Physical Exam Physical Examination:   Vitals:   04/22/17 0820  BP: (!) 188/86  Pulse: 99    General Examination: The patient is a very pleasant 31 y.o. male in no acute distress. He appears well-developed and well-nourished and well groomed. Good spirits.   HEENT: Normocephalic, atraumatic, pupils are equal, round and reactive to light and accommodation. Extraocular tracking is good without limitation to gaze excursion or nystagmus noted. Normal smooth pursuit is noted. Hearing is grossly intact. Face is symmetric with normal facial animation and normal facial sensation. Speech is clear with no dysarthria noted. There is no hypophonia. There is no lip, neck/head, jaw or voice tremor. Neck is supple with full range of motion. Oropharynx exam reveals: mild mouth dryness, adequate dental hygiene and moderate airway crowding. Mallampati is class I. Tongue protrudes centrally and palate elevates symmetrically.   Chest: Clear to auscultation without wheezing, rhonchi or crackles noted.  Heart: S1+S2+0, regular and normal without murmurs, rubs or gallops noted.   Abdomen: Soft, non-tender and non-distended with normal bowel sounds appreciated on auscultation.  Extremities: There is no pitting edema in the distal lower  extremities bilaterally. Pedal pulses are intact.  Skin: Warm and dry without trophic changes noted.   Musculoskeletal: exam reveals no obvious joint deformities, tenderness or joint swelling or erythema.  Neurologically:  Mental status: The patient is awake, alert and oriented in all 4 spheres. His immediate and remote memory, attention, language skills and fund of knowledge are appropriate. There is no evidence of aphasia, agnosia, apraxia or anomia. Speech is clear with normal prosody and enunciation. Thought process is linear. Mood is normal and affect is normal.  Cranial nerves II - XII are as described above under HEENT exam. In addition: shoulder shrug is normal with equal shoulder height noted. Motor exam: Normal bulk, strength and tone is noted. There is no drift, tremor or rebound. Romberg is negative. Reflexes are 2+ throughout. Fine motor skills and coordination: intact with normal finger taps, normal hand movements, normal rapid alternating patting, normal foot taps and normal foot agility.  Cerebellar testing: No dysmetria or intention tremor on finger to nose testing. Heel to shin is unremarkable bilaterally. There is no truncal or gait ataxia.  Sensory exam: intact to light touch in the upper and lower extremities.  Gait, station and balance: He stands easily. No veering to one side is noted. No leaning to one side is noted. Posture is age-appropriate and stance is narrow based. Gait shows normal stride length and normal pace. No problems turning are noted.   Assessment and Plan:   In summary, Jeffery Soto is a very pleasant 31 year old male with an underlying medical history of allergies, previous history of paresthesias, hypothyroidism, elevated blood pressure values, and obesity, who presents for follow-up consultation of his obstructive sleep apnea, well established on CPAP therapy. He reports ongoing good results and is fully compliant with treatment. He is commended for  his treatment adherence. He takes his machine on his travels as well. He has gained weight. He is motivated to work on weight loss. He is well treated with a pressure of 8 cm via nasal mask. I placed an order for updated supplies from his DME company. He can follow-up in one year. I answered all his questions today and he was in agreement.  I spent 20 minutes in total face-to-face time with the patient, more than 50% of which was spent in counseling and coordination of care, reviewing test results, reviewing medication and discussing or reviewing the diagnosis of OSA, its prognosis and treatment options. Pertinent laboratory and imaging test results that were available during this visit with the patient were reviewed by me and considered in my medical decision making (see chart for details).

## 2017-04-22 NOTE — Patient Instructions (Signed)
Keep up the good work! I will see you back in one year. Work on [employer]weight loss!

## 2017-04-27 DIAGNOSIS — Z0000 Encounter for general adult medical examination without abnormal findings: Secondary | ICD-10-CM | POA: Diagnosis not present

## 2017-04-27 DIAGNOSIS — E038 Other specified hypothyroidism: Secondary | ICD-10-CM | POA: Diagnosis not present

## 2017-04-27 DIAGNOSIS — R7301 Impaired fasting glucose: Secondary | ICD-10-CM | POA: Diagnosis not present

## 2017-04-27 DIAGNOSIS — E298 Other testicular dysfunction: Secondary | ICD-10-CM | POA: Diagnosis not present

## 2017-04-27 DIAGNOSIS — R7309 Other abnormal glucose: Secondary | ICD-10-CM | POA: Diagnosis not present

## 2017-05-04 DIAGNOSIS — Z1389 Encounter for screening for other disorder: Secondary | ICD-10-CM | POA: Diagnosis not present

## 2017-05-04 DIAGNOSIS — R7309 Other abnormal glucose: Secondary | ICD-10-CM | POA: Diagnosis not present

## 2017-05-04 DIAGNOSIS — F418 Other specified anxiety disorders: Secondary | ICD-10-CM | POA: Diagnosis not present

## 2017-05-04 DIAGNOSIS — Z6839 Body mass index (BMI) 39.0-39.9, adult: Secondary | ICD-10-CM | POA: Diagnosis not present

## 2017-05-04 DIAGNOSIS — Z0000 Encounter for general adult medical examination without abnormal findings: Secondary | ICD-10-CM | POA: Diagnosis not present

## 2017-05-04 DIAGNOSIS — E298 Other testicular dysfunction: Secondary | ICD-10-CM | POA: Diagnosis not present

## 2017-05-04 DIAGNOSIS — R5381 Other malaise: Secondary | ICD-10-CM | POA: Diagnosis not present

## 2017-05-04 DIAGNOSIS — Z23 Encounter for immunization: Secondary | ICD-10-CM | POA: Diagnosis not present

## 2017-07-22 DIAGNOSIS — J111 Influenza due to unidentified influenza virus with other respiratory manifestations: Secondary | ICD-10-CM | POA: Diagnosis not present

## 2017-07-22 DIAGNOSIS — J029 Acute pharyngitis, unspecified: Secondary | ICD-10-CM | POA: Diagnosis not present

## 2017-07-22 DIAGNOSIS — R112 Nausea with vomiting, unspecified: Secondary | ICD-10-CM | POA: Diagnosis not present

## 2017-07-22 DIAGNOSIS — R197 Diarrhea, unspecified: Secondary | ICD-10-CM | POA: Diagnosis not present

## 2017-12-07 DIAGNOSIS — H01022 Squamous blepharitis right lower eyelid: Secondary | ICD-10-CM | POA: Diagnosis not present

## 2017-12-07 DIAGNOSIS — H01021 Squamous blepharitis right upper eyelid: Secondary | ICD-10-CM | POA: Diagnosis not present

## 2017-12-07 DIAGNOSIS — H01025 Squamous blepharitis left lower eyelid: Secondary | ICD-10-CM | POA: Diagnosis not present

## 2017-12-07 DIAGNOSIS — H01024 Squamous blepharitis left upper eyelid: Secondary | ICD-10-CM | POA: Diagnosis not present

## 2018-02-27 DIAGNOSIS — H6502 Acute serous otitis media, left ear: Secondary | ICD-10-CM | POA: Diagnosis not present

## 2018-03-03 DIAGNOSIS — J019 Acute sinusitis, unspecified: Secondary | ICD-10-CM | POA: Diagnosis not present

## 2018-03-03 DIAGNOSIS — H6692 Otitis media, unspecified, left ear: Secondary | ICD-10-CM | POA: Diagnosis not present

## 2018-03-03 DIAGNOSIS — Z6841 Body Mass Index (BMI) 40.0 and over, adult: Secondary | ICD-10-CM | POA: Diagnosis not present

## 2018-04-20 ENCOUNTER — Encounter: Payer: Self-pay | Admitting: Neurology

## 2018-04-26 ENCOUNTER — Ambulatory Visit (INDEPENDENT_AMBULATORY_CARE_PROVIDER_SITE_OTHER): Payer: BLUE CROSS/BLUE SHIELD | Admitting: Neurology

## 2018-04-26 ENCOUNTER — Encounter: Payer: Self-pay | Admitting: Neurology

## 2018-04-26 VITALS — BP 158/98 | HR 103 | Ht 71.0 in | Wt 298.0 lb

## 2018-04-26 DIAGNOSIS — Z9989 Dependence on other enabling machines and devices: Secondary | ICD-10-CM | POA: Diagnosis not present

## 2018-04-26 DIAGNOSIS — G4733 Obstructive sleep apnea (adult) (pediatric): Secondary | ICD-10-CM

## 2018-04-26 NOTE — Patient Instructions (Addendum)
Please continue using your CPAP regularly. While your insurance requires that you use CPAP at least 4 hours each night on 70% of the nights, I recommend, that you not skip any nights and use it throughout the night if you can. Getting used to CPAP and staying with the treatment long term does take time and patience and discipline. Untreated obstructive sleep apnea when it is moderate to severe can have an adverse impact on cardiovascular health and raise her risk for heart disease, arrhythmias, hypertension, congestive heart failure, stroke and diabetes. Untreated obstructive sleep apnea causes sleep disruption, nonrestorative sleep, and sleep deprivation. This can have an impact on your day to day functioning and cause daytime sleepiness and impairment of cognitive function, memory loss, mood disturbance, and problems focussing. Using CPAP regularly can improve these symptoms.  Keep up the good work! We can see you in 1 year, you can see one of our nurse practitioners as you are stable. I will see you after that.   Please follow up with your primary care doctor about your blood pressure. Please work on weight loss as well.

## 2018-04-26 NOTE — Progress Notes (Signed)
Subjective:    Patient ID: [id number] CU is a 32 y.o. male.  HPI     Interim history:   Mr. Conradt is a 32 year old right-handed gentleman with an underlying medical history of allergies, previous oral paresthesias, hypothyroidism, and obesity, who presents for follow up consultation of his OSA on CPAP therapy. The patient is unaccompanied today and presents for his yearly checkup. I last saw him on 04/22/2017, at which time he was fully compliant with his CPAP and doing well.   Today, 04/26/2018: I reviewed his CPAP compliance data from 03/22/2018 through 04/20/2018 which is a total of 30 days, during which time he used his CPAP every night with percent used days greater than 4 hours at 100%, indicating superb compliance with an average usage of 6 hours and 47 minutes, residual AHI at goal at 0.5 per hour, leak on the low side with the 95th percentile at 3.6 L/m on a pressure of 8 cm with EPR of 2. He reports doing well with CPAP. His blood pressure is elevated this morning, which he attributes to increased salt intake lately (roasted sunflower seeds) and stress (trying to have a baby and wife maybe pregnant). He is up-to-date with his supplies. He admits that he has gained weight.   The patient's allergies, current medications, family history, past medical history, past social history, past surgical history and problem list were reviewed and updated as appropriate.     Previously:  I saw him on 04/21/2016, at which time he was compliant with CPAP. He had gained some weight and noted some increase in snoring. I increased his CPAP pressure to 8 cm. He had some blood pressure increased as well. He was advised to routinely follow-up in one year as he was stable otherwise.   I reviewed his CPAP compliance data from 03/22/2017 through 04/20/2017 which is a total of 30 days, during which time he used his CPAP every night with percent used days greater than 4 hours at 97%, indicating excellent  compliance with an average usage of 6 hours and 45 minutes, residual AHI at goal at 0.5 per hour, leak low with the 95th percentile at 3.9 L/m on a pressure of 8 cm with EPR of 2.    I first met him on 05/30/15, at which time we talked about his 2 sleep studies and his compliance and his symptoms. He indicated that he was feeling better since starting CPAP therapy. He was compliant with treatment in the first 90 days but then compliance was a little less than 70% and the most recent month. He was reminded to be fully compliant with treatment to get the full benefit of CPAP therapy.   I reviewed his CPAP compliance data from 03/18/2016 through 04/16/2016 which is a total of 30 days, during which time he used his machine every night with percent used days greater than 4 hours at 90%, indicating excellent compliance with an average usage of 6 hours and 17 minutes, residual AHI low at 0.5 per hour, leak low with the 95th percentile at 0.2 L/m on a pressure of 7 cm with EPR of 2.   05/30/2015: He had a baseline sleep study in July 2015 and a CPAP titration study in September 2015. He did not make a follow-up appointment after his sleep studies fo discussion of his sleep studies and management of his OSA. This is our first visit today. I talked to him about his sleep test results. His baseline sleep study from 11/29/2013  showed a sleep efficiency of 87.8% with a sleep latency of 10 minutes and wake after sleep onset of 41 minutes with mild sleep fragmentation noted. He had normal percentages for light stage sleep, a normal percentage of slow-wave sleep and a new normal percentage of REM sleep with a mildly prolonged REM latency. He had no significant PLMS, EKG or EEG changes. Had moderate to loud snoring. Total AHI was 5.9 per hour, rising to 23.8 per hour during REM sleep. Average oxygen saturation was 94%, nadir was 85%.  Based on his test results I invited him back for a titration study.  He had this on  01/24/2014. Sleep efficiency was 93.5%, sleep latency was 9 minutes, wake after sleep onset 18.5 minutes with mild sleep fragmentation noted. He had a normal arousal index. He had an increased percentage of stage II sleep, a mildly decreased percentage of slow-wave sleep and a mildly decreased percentage of REM sleep with a prolonged REM latency. He had no significant PLMS, EKG or EEG changes. Average oxygen saturation was 94%, nadir was 88%. CPAP was titrated from 5 cm to 7 cm. AHI was 0 per hour on the final pressure. Supine REM sleep was achieved.  Based on his test results I prescribed CPAP therapy for home use. I previously reviewed his CPAP compliance data from 02/20/2014 through 03/21/2014 which is a total of 30 days during which time he used his machine every night with percent used days greater than 4 hours at 83%, indicating very good compliance with an average usage of 5 hours and 54 minutes, residual AHI low at 0.7 per hour, leaked low with the 95th percentile at 4.4 L/m on a pressure of 7 cm with EPR of 2. I reviewed his CPAP compliance data from 04/29/2015 through 05/28/2015 which is a total of 30 days during which time he used his machine 24 days with percent used days greater than 4 hours at 63%, indicating suboptimal compliance with an average usage of 4 hours and 39 minutes, residual AHI low at 0.3 per hour, leak low with the 95th percentile at 0.1 L/m on a pressure of 7 cm with EPR of 2. Looking at his last 90 day compliance data, he was 73% compliant. 05/30/2015: He reports feeling much better with regards to his sleep since he started CPAP therapy in 2015. He takes care of his machine in his mask. He needs supplies however. He still has the original mask and supplies. His DME company is [facility]. He is trying to lose weight but did gain some. He now has a treadmill and uses this at least 2 or 3 times per week. His oral paresthesias have eventually resolved. He was using gabapentin  for some time and was abstaining from alcohol as it was a trigger of his oral/gum pain after tooth surgery. He got married in October. He goes to bed around 9 or 10 PM. Rise time is between 4:30 and 6 AM. His wife is a paramedic and he tries to adhere to her sleep schedule. He does not watch TV in bed. He likes to read some. 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 He drinks alcohol occasionally and resumed occasional alcohol consumption after October 2016. He is a nonsmoker. He drinks quite a bit of coffee, about 20 oz or little more per day. He does not drink sodas typically. He denies restless leg symptoms, morning headaches or nocturia. He feels that he wakes  up better rested when he uses CPAP. The recent lapse in treatment was secondary to a sinus infection. He does get tonsillitis about 2 or 3 times per year.  His Past Medical History Is Significant For: Past Medical History:  Diagnosis Date  . Asthma   . High cholesterol   . Hyperthyroidism     His Past Surgical History Is Significant For: Past Surgical History:  Procedure Laterality Date  . MOUTH SURGERY  2014  . None      His Family History Is Significant For: Family History  Problem Relation Age of Onset  . Heart attack Paternal Grandfather   . Irritable bowel syndrome Mother   . Arthritis Mother     His Social History Is Significant For: Social History   Socioeconomic History  . Marital status: Married    Spouse name: Not on file  . Number of children: 0  . Years of education: Masters  . Highest education level: Not on file  Occupational History  . Not on file  Social Needs  . Financial resource strain: Not on file  . Food insecurity:    Worry: Not on file    Inability: Not on file  . Transportation needs:    Medical: Not on file    Non-medical: Not on file  Tobacco Use  . Smoking status: Former Smoker    Last attempt to quit: 07/02/2013    Years since quitting: 4.8  . Smokeless tobacco:  Never Used  Substance and Sexual Activity  . Alcohol use: Yes    Comment: 2 times a week   . Drug use: No  . Sexual activity: Not on file  Lifestyle  . Physical activity:    Days per week: Not on file    Minutes per session: Not on file  . Stress: Not on file  Relationships  . Social connections:    Talks on phone: Not on file    Gets together: Not on file    Attends religious service: Not on file    Active member of club or organization: Not on file    Attends meetings of clubs or organizations: Not on file    Relationship status: Not on file  Other Topics Concern  . Not on file  Social History Narrative   Patient lives at home home with girlfriend Velna Hatchet.   Patient has no children.    Masters in Press photographer    Patient is not married.    Patient is left handed.    Patient is currently working full time.     His Allergies Are:  Allergies  Allergen Reactions  . Amoxicillin Swelling  . Penicillins   :   His Current Medications Are:  Outpatient Encounter Medications as of 04/26/2018  Medication Sig  . albuterol (PROVENTIL HFA;VENTOLIN HFA) 108 (90 Base) MCG/ACT inhaler Inhale into the lungs.  Marland Kitchen Fexofenadine HCl (ALLEGRA PO) Take by mouth as needed.  Marland Kitchen levothyroxine (SYNTHROID, LEVOTHROID) 75 MCG tablet Take 1 tablet (75 mcg total) by mouth daily. Follow up for repeat testing in 2 months   No facility-administered encounter medications on file as of 04/26/2018.   :  Review of Systems:  Out of a complete 14 point review of systems, all are reviewed and negative with the exception of these symptoms as listed below: Review of Systems  Neurological:       Pt presents today for CPAP follow up. Pt gets supplies from [facility]. No complaints    Objective:  Neurological Exam  Physical Exam Physical Examination:   Vitals:   04/26/18 0818  BP: (!) 191/106  Pulse: (!) 103   158/98 upon recheck at end of visit.   General Examination: The patient is a very pleasant 32 y.o.  male in no acute distress. He appears well-developed and well-nourished and well groomed.   HEENT:Normocephalic, atraumatic, pupils are equal, round and reactive to light and accommodation. Extraocular tracking is good without limitation to gaze excursion or nystagmus noted. Normal smooth pursuit is noted. Hearing is grossly intact. Face is symmetric with normal facial animation and normal facial sensation. Speech is clear with no dysarthria noted. There is no hypophonia. There is no lip, neck/head, jaw or voice tremor. Neck is supple with full range of motion. Oropharynx exam reveals: no new findings.   Chest:Clear to auscultation without wheezing, rhonchi or crackles noted.  Heart:S1+S2+0, regular and normal without murmurs, rubs or gallops noted.   Abdomen:Soft, non-tender and non-distended with normal bowel sounds appreciated on auscultation.  Extremities:There is no pitting edema in the distal lower extremities bilaterally.   Skin: Warm and dry without trophic changes noted.   Musculoskeletal: exam reveals no obvious joint deformities, tenderness or joint swelling or erythema.   Neurologically:  Mental status: The patient is awake, alert and oriented in all 4 spheres. His immediate and remote memory, attention, language skills and fund of knowledge are appropriate. There is no evidence of aphasia, agnosia, apraxia or anomia. Speech is clear with normal prosody and enunciation. Thought process is linear. Mood is normal and affect is normal.  Cranial nerves II - XII are as described above under HEENT exam. In addition: shoulder shrug is normal with equal shoulder height noted. Motor exam: Normal bulk, strength and tone is noted. There is no tremor. Fine motor skills and coordination: grossly intact.  Cerebellar testing: No dysmetria or intention tremor on finger to nose testing. Heel to shin is unremarkable bilaterally. There is no truncal or gait ataxia.  Sensory exam: intact to  light touch in the upper and lower extremities.  Gait, station and balance: He stands easily. No veering to one side is noted. No leaning to one side is noted. Posture is age-appropriate and stance is narrow based. Gait shows normal stride length and normal pace. No problems turning are noted.   Assessment and Plan:   In summary, DONYA TOMARO is a very pleasant 32 year old male with an underlying medical history of allergies, previoushistory of paresthesias, hypothyroidism, elevated blood pressure values, and obesity, as well as weight gain, whopresents for follow-up consultation of his obstructive sleep apnea, well established on CPAP therapy. He reports ongoing good results and continues to be fully compliant with treatment. He is commended for his treatment adherence. He is advised to try to lose weight. His weight has been fluctuating significantly. He is motivated to work on weight loss and indicates full compliance with CPAP therapy. He stays up-to-date with his supplies through his DME company. He is advised to talk to his primary care physician about blood pressure management. It was better upon recheck at the end of our visit today. He is advised to follow-up routinely in one year, he can see one of our nurse practitioners next time. I answered all his questions today and he was in agreement. I spent 15 minutes in total face-to-face time with the patient, more than 50% of which was spent in counseling and coordination of care, reviewing test results, reviewing medication and discussing or reviewing the diagnosis of OSA,  its prognosis and treatment options. Pertinent laboratory and imaging test results that were available during this visit with the patient were reviewed by me and considered in my medical decision making (see chart for details).

## 2018-04-29 DIAGNOSIS — R7309 Other abnormal glucose: Secondary | ICD-10-CM | POA: Diagnosis not present

## 2018-04-29 DIAGNOSIS — E291 Testicular hypofunction: Secondary | ICD-10-CM | POA: Diagnosis not present

## 2018-04-29 DIAGNOSIS — Z0000 Encounter for general adult medical examination without abnormal findings: Secondary | ICD-10-CM | POA: Diagnosis not present

## 2018-04-29 DIAGNOSIS — R82998 Other abnormal findings in urine: Secondary | ICD-10-CM | POA: Diagnosis not present

## 2018-04-29 DIAGNOSIS — E038 Other specified hypothyroidism: Secondary | ICD-10-CM | POA: Diagnosis not present

## 2018-04-29 DIAGNOSIS — Z125 Encounter for screening for malignant neoplasm of prostate: Secondary | ICD-10-CM | POA: Diagnosis not present

## 2018-05-05 DIAGNOSIS — F418 Other specified anxiety disorders: Secondary | ICD-10-CM | POA: Diagnosis not present

## 2018-05-05 DIAGNOSIS — R740 Nonspecific elevation of levels of transaminase and lactic acid dehydrogenase [LDH]: Secondary | ICD-10-CM | POA: Diagnosis not present

## 2018-05-05 DIAGNOSIS — R7309 Other abnormal glucose: Secondary | ICD-10-CM | POA: Diagnosis not present

## 2018-05-05 DIAGNOSIS — Z1389 Encounter for screening for other disorder: Secondary | ICD-10-CM | POA: Diagnosis not present

## 2018-05-05 DIAGNOSIS — E291 Testicular hypofunction: Secondary | ICD-10-CM | POA: Diagnosis not present

## 2018-05-05 DIAGNOSIS — Z0000 Encounter for general adult medical examination without abnormal findings: Secondary | ICD-10-CM | POA: Diagnosis not present

## 2018-05-05 DIAGNOSIS — M109 Gout, unspecified: Secondary | ICD-10-CM | POA: Diagnosis not present

## 2018-05-12 DIAGNOSIS — F418 Other specified anxiety disorders: Secondary | ICD-10-CM | POA: Diagnosis not present

## 2018-06-13 DIAGNOSIS — Z125 Encounter for screening for malignant neoplasm of prostate: Secondary | ICD-10-CM | POA: Diagnosis not present

## 2018-06-13 DIAGNOSIS — E349 Endocrine disorder, unspecified: Secondary | ICD-10-CM | POA: Diagnosis not present

## 2018-06-14 DIAGNOSIS — F418 Other specified anxiety disorders: Secondary | ICD-10-CM | POA: Diagnosis not present

## 2018-08-02 DIAGNOSIS — F418 Other specified anxiety disorders: Secondary | ICD-10-CM | POA: Diagnosis not present

## 2018-08-16 DIAGNOSIS — G4733 Obstructive sleep apnea (adult) (pediatric): Secondary | ICD-10-CM | POA: Diagnosis not present

## 2018-09-30 DIAGNOSIS — Z8659 Personal history of other mental and behavioral disorders: Secondary | ICD-10-CM | POA: Diagnosis not present

## 2018-09-30 DIAGNOSIS — F418 Other specified anxiety disorders: Secondary | ICD-10-CM | POA: Diagnosis not present

## 2018-11-03 DIAGNOSIS — F418 Other specified anxiety disorders: Secondary | ICD-10-CM | POA: Diagnosis not present

## 2018-11-30 DIAGNOSIS — M109 Gout, unspecified: Secondary | ICD-10-CM | POA: Diagnosis not present

## 2018-11-30 DIAGNOSIS — F418 Other specified anxiety disorders: Secondary | ICD-10-CM | POA: Diagnosis not present

## 2018-11-30 DIAGNOSIS — I10 Essential (primary) hypertension: Secondary | ICD-10-CM | POA: Diagnosis not present

## 2019-03-03 DIAGNOSIS — F418 Other specified anxiety disorders: Secondary | ICD-10-CM | POA: Diagnosis not present

## 2019-03-24 DIAGNOSIS — F418 Other specified anxiety disorders: Secondary | ICD-10-CM | POA: Diagnosis not present

## 2019-04-30 ENCOUNTER — Encounter: Payer: Self-pay | Admitting: Adult Health

## 2019-05-02 ENCOUNTER — Encounter: Payer: Self-pay | Admitting: Adult Health

## 2019-05-02 ENCOUNTER — Ambulatory Visit (INDEPENDENT_AMBULATORY_CARE_PROVIDER_SITE_OTHER): Payer: BC Managed Care – PPO | Admitting: Adult Health

## 2019-05-02 ENCOUNTER — Other Ambulatory Visit: Payer: Self-pay

## 2019-05-02 VITALS — BP 122/73 | HR 81 | Temp 93.3°F | Ht 71.0 in | Wt 304.8 lb

## 2019-05-02 DIAGNOSIS — G4733 Obstructive sleep apnea (adult) (pediatric): Secondary | ICD-10-CM

## 2019-05-02 DIAGNOSIS — Z9989 Dependence on other enabling machines and devices: Secondary | ICD-10-CM

## 2019-05-02 NOTE — Progress Notes (Signed)
PATIENT: Jeffery Soto DOB: [birth year]  REASON FOR VISIT: follow up HISTORY FROM: patient  HISTORY OF PRESENT ILLNESS: Today 05/02/19:  Mr. Toren is a 33 year old male with a history of obstructive sleep apnea on CPAP.  He returns today for follow-up.  His download indicates that he uses machine 30 out of 30 days for compliance of 100%.  He uses machine greater than 4 hours 27 days for compliance of 90%.  On average he uses his machine 6 hours and 12 minutes.  His residual AHI is 0.7 on 8 cm of water with EPR 2.  His leak in the 95th percentile is 3.6 L/min.  He reports that the CPAP is working well for him.  He denies any new issues.  He returns today for an evaluation.  HISTORY 04/26/2018: I reviewed his CPAP compliance data from 03/22/2018 through 04/20/2018 which is a total of 30 days, during which time he used his CPAP every night with percent used days greater than 4 hours at 100%, indicating superb compliance with an average usage of 6 hours and 47 minutes, residual AHI at goal at 0.5 per hour, leak on the low side with the 95th percentile at 3.6 L/m on a pressure of 8 cm with EPR of 2. He reports doing well with CPAP. His blood pressure is elevated this morning, which he attributes to increased salt intake lately (roasted sunflower seeds) and stress (trying to have a baby and wife maybe pregnant). He is up-to-date with his supplies. He admits that he has gained weight.   REVIEW OF SYSTEMS: Out of a complete 14 system review of symptoms, the patient complains only of the following symptoms, and all other reviewed systems are negative.  Epworth sleepiness score 2 fatigue severity score 19  ALLERGIES: Allergies  Allergen Reactions  . Amoxicillin Swelling  . Penicillins     HOME MEDICATIONS: Outpatient Medications Prior to Visit  Medication Sig Dispense Refill  . albuterol (PROVENTIL HFA;VENTOLIN HFA) 108 (90 Base) MCG/ACT inhaler Inhale into the lungs.    Marland Kitchen escitalopram  (LEXAPRO) 10 MG tablet Take 10 mg by mouth every morning.    Marland Kitchen Fexofenadine HCl (ALLEGRA PO) Take by mouth as needed.    Marland Kitchen levothyroxine (SYNTHROID, LEVOTHROID) 75 MCG tablet Take 1 tablet (75 mcg total) by mouth daily. Follow up for repeat testing in 2 months 90 tablet 0   No facility-administered medications prior to visit.    PAST MEDICAL HISTORY: Past Medical History:  Diagnosis Date  . Asthma   . High cholesterol   . Hyperthyroidism     PAST SURGICAL HISTORY: Past Surgical History:  Procedure Laterality Date  . MOUTH SURGERY  2014  . None      FAMILY HISTORY: Family History  Problem Relation Age of Onset  . Heart attack Paternal Grandfather   . Irritable bowel syndrome Mother   . Arthritis Mother     SOCIAL HISTORY: Social History   Socioeconomic History  . Marital status: Married    Spouse name: Not on file  . Number of children: 0  . Years of education: Masters  . Highest education level: Not on file  Occupational History  . Not on file  Tobacco Use  . Smoking status: Former Smoker    Quit date: 07/02/2013    Years since quitting: 5.8  . Smokeless tobacco: Never Used  Substance and Sexual Activity  . Alcohol use: Yes    Comment: 2 times a week   . Drug  use: No  . Sexual activity: Not on file  Other Topics Concern  . Not on file  Social History Narrative   Patient lives at home home with girlfriend Velna Hatchet.   Patient has no children.    Masters in Press photographer    Patient is not married.    Patient is left handed.    Patient is currently working full time.    Social Determinants of Health   Financial Resource Strain:   . Difficulty of Paying Living Expenses: Not on file  Food Insecurity:   . Worried About Charity fundraiser in the Last Year: Not on file  . Ran Out of Food in the Last Year: Not on file  Transportation Needs:   . Lack of Transportation (Medical): Not on file  . Lack of Transportation (Non-Medical): Not on file  Physical Activity:    . Days of Exercise per Week: Not on file  . Minutes of Exercise per Session: Not on file  Stress:   . Feeling of Stress : Not on file  Social Connections:   . Frequency of Communication with Friends and Family: Not on file  . Frequency of Social Gatherings with Friends and Family: Not on file  . Attends Religious Services: Not on file  . Active Member of Clubs or Organizations: Not on file  . Attends Archivist Meetings: Not on file  . Marital Status: Not on file  Intimate Partner Violence:   . Fear of Current or Ex-Partner: Not on file  . Emotionally Abused: Not on file  . Physically Abused: Not on file  . Sexually Abused: Not on file      PHYSICAL EXAM  Vitals:   05/02/19 0830  BP: 122/73  Pulse: 81  Temp: (!) 93.3 F (34.1 C)  TempSrc: Oral  Weight: (!) 304 lb 12.8 oz (138.3 kg)  Height: 5\' 11"  (1.803 m)   Body mass index is 42.51 kg/m.  Generalized: Well developed, in no acute distress  Chest: Lungs clear to auscultation bilaterally  Neurological examination  Mentation: Alert oriented to time, place, history taking. Follows all commands speech and language fluent Cranial nerve II-XII: Extraocular movements were full, visual field were full on confrontational test Head turning and shoulder shrug  were normal and symmetric. Motor: The motor testing reveals 5 over 5 strength of all 4 extremities. Good symmetric motor tone is noted throughout.  Sensory: Sensory testing is intact to soft touch on all 4 extremities. No evidence of extinction is noted.  Gait and station: Gait is normal.    DIAGNOSTIC DATA (LABS, IMAGING, TESTING) - I reviewed patient records, labs, notes, testing and imaging myself where available.  Lab Results  Component Value Date   WBC 7.6 04/24/2015   HGB 14.6 04/24/2015   HCT 42.0 (A) 04/24/2015   MCV 83.2 04/24/2015      Component Value Date/Time   NA 141 04/24/2015 0952   K 4.8 04/24/2015 0952   CL 99 04/24/2015 0952   CO2  23 04/24/2015 0952   GLUCOSE 86 04/24/2015 0952   BUN 12 04/24/2015 0952   CREATININE 0.81 04/24/2015 0952   CALCIUM 9.8 04/24/2015 0952   PROT 7.5 04/24/2015 0952   ALBUMIN 4.6 04/24/2015 0952   AST 41 (H) 04/24/2015 0952   ALT 61 (H) 04/24/2015 0952   ALKPHOS 61 04/24/2015 0952   BILITOT 0.5 04/24/2015 0952   Lab Results  Component Value Date   CHOL 227 (H) 04/24/2015   HDL 49  04/24/2015   LDLCALC 141 (H) 04/24/2015   TRIG 187 (H) 04/24/2015   CHOLHDL 4.6 04/24/2015   Lab Results  Component Value Date   HGBA1C 5.5 04/24/2015   No results found for: VITAMINB12 Lab Results  Component Value Date   TSH 4.655 (H) 04/24/2015      ASSESSMENT AND PLAN 33 y.o. year old male  has a past medical history of Asthma, High cholesterol, and Hyperthyroidism. here with :  1. Obstructive sleep apnea on CPAP  The patient's CPAP download shows excellent compliance and good treatment of his apnea.  He is encouraged to continue using CPAP nightly and greater than 4 hours each night.  He would like to switch his DME company to [employer]. he is advised that if his symptoms worsen or he develops new symptoms he should let us know.  He will follow-up in 1 year or sooner if needed   I spent 15 minutes with the patient. 50% of this time was spent reviewing CPAP download   Butch PennyMegan Zniyah Midkiff, MSN, NP-C 05/02/2019, 8:39 AM [facility] [address], [address] [place], [place] [address] [phone]

## 2019-05-02 NOTE — Patient Instructions (Signed)

## 2019-05-09 DIAGNOSIS — G4733 Obstructive sleep apnea (adult) (pediatric): Secondary | ICD-10-CM | POA: Diagnosis not present

## 2019-05-10 DIAGNOSIS — E039 Hypothyroidism, unspecified: Secondary | ICD-10-CM | POA: Diagnosis not present

## 2019-05-10 DIAGNOSIS — R7309 Other abnormal glucose: Secondary | ICD-10-CM | POA: Diagnosis not present

## 2019-05-10 DIAGNOSIS — E291 Testicular hypofunction: Secondary | ICD-10-CM | POA: Diagnosis not present

## 2019-05-10 DIAGNOSIS — E7849 Other hyperlipidemia: Secondary | ICD-10-CM | POA: Diagnosis not present

## 2019-05-10 DIAGNOSIS — Z125 Encounter for screening for malignant neoplasm of prostate: Secondary | ICD-10-CM | POA: Diagnosis not present

## 2019-05-10 DIAGNOSIS — F418 Other specified anxiety disorders: Secondary | ICD-10-CM | POA: Diagnosis not present

## 2019-05-10 DIAGNOSIS — M109 Gout, unspecified: Secondary | ICD-10-CM | POA: Diagnosis not present

## 2019-05-17 DIAGNOSIS — Z0000 Encounter for general adult medical examination without abnormal findings: Secondary | ICD-10-CM | POA: Diagnosis not present

## 2019-05-17 DIAGNOSIS — F330 Major depressive disorder, recurrent, mild: Secondary | ICD-10-CM | POA: Diagnosis not present

## 2019-05-17 DIAGNOSIS — Z1389 Encounter for screening for other disorder: Secondary | ICD-10-CM | POA: Diagnosis not present

## 2019-05-17 DIAGNOSIS — I10 Essential (primary) hypertension: Secondary | ICD-10-CM | POA: Diagnosis not present

## 2019-05-17 DIAGNOSIS — M109 Gout, unspecified: Secondary | ICD-10-CM | POA: Diagnosis not present

## 2019-05-31 ENCOUNTER — Telehealth: Payer: Self-pay

## 2019-05-31 NOTE — Telephone Encounter (Signed)
CPAP orders have been faxed back to [facility]. Confirmation fax has been received.

## 2019-06-09 DIAGNOSIS — G4733 Obstructive sleep apnea (adult) (pediatric): Secondary | ICD-10-CM | POA: Diagnosis not present

## 2019-06-13 DIAGNOSIS — F419 Anxiety disorder, unspecified: Secondary | ICD-10-CM | POA: Diagnosis not present

## 2019-07-10 DIAGNOSIS — G4733 Obstructive sleep apnea (adult) (pediatric): Secondary | ICD-10-CM | POA: Diagnosis not present

## 2019-07-24 DIAGNOSIS — F419 Anxiety disorder, unspecified: Secondary | ICD-10-CM | POA: Diagnosis not present

## 2019-08-07 DIAGNOSIS — G4733 Obstructive sleep apnea (adult) (pediatric): Secondary | ICD-10-CM | POA: Diagnosis not present

## 2019-09-07 DIAGNOSIS — G4733 Obstructive sleep apnea (adult) (pediatric): Secondary | ICD-10-CM | POA: Diagnosis not present

## 2019-11-07 DIAGNOSIS — G4733 Obstructive sleep apnea (adult) (pediatric): Secondary | ICD-10-CM | POA: Diagnosis not present

## 2019-12-07 DIAGNOSIS — G4733 Obstructive sleep apnea (adult) (pediatric): Secondary | ICD-10-CM | POA: Diagnosis not present

## 2020-01-07 DIAGNOSIS — G4733 Obstructive sleep apnea (adult) (pediatric): Secondary | ICD-10-CM | POA: Diagnosis not present

## 2020-02-07 DIAGNOSIS — G4733 Obstructive sleep apnea (adult) (pediatric): Secondary | ICD-10-CM | POA: Diagnosis not present

## 2020-03-13 DIAGNOSIS — Z0189 Encounter for other specified special examinations: Secondary | ICD-10-CM | POA: Diagnosis not present

## 2020-05-02 ENCOUNTER — Encounter: Payer: Self-pay | Admitting: Adult Health

## 2020-05-02 ENCOUNTER — Ambulatory Visit: Payer: BC Managed Care – PPO | Admitting: Adult Health

## 2020-05-02 ENCOUNTER — Other Ambulatory Visit: Payer: Self-pay

## 2020-05-02 VITALS — BP 128/80 | Ht 71.0 in | Wt 304.8 lb

## 2020-05-02 DIAGNOSIS — Z9989 Dependence on other enabling machines and devices: Secondary | ICD-10-CM

## 2020-05-02 DIAGNOSIS — G4733 Obstructive sleep apnea (adult) (pediatric): Secondary | ICD-10-CM

## 2020-05-02 NOTE — Patient Instructions (Signed)

## 2020-05-02 NOTE — Progress Notes (Addendum)
PATIENT: Jeffery Soto DOB: 27-Sep-1985  REASON FOR VISIT: follow up HISTORY FROM: patient  HISTORY OF PRESENT ILLNESS: Today 05/02/20:  [name] is a 34 year old male with a history of obstructive sleep apnea on CPAP.  His download indicates that he uses machine 29 out of 30 days for compliance of 97%.  He uses machine greater than 4 hours 26 out of 30 days for compliance of 87%.  On average he uses his machine 5 hours and 45 minutes.  His residual AHI is 0.4 on 8 cm water with EPR 2.  Leak in the 95th percentile is 13.7 L/min.  He reports that the CPAP is working well for him.  He denies any new issues.  He returns today for an evaluation.  HISTORY 05/02/19:  [name] is a 34 year old male with a history of obstructive sleep apnea on CPAP.  He returns today for follow-up.  His download indicates that he uses machine 30 out of 30 days for compliance of 100%.  He uses machine greater than 4 hours 27 days for compliance of 90%.  On average he uses his machine 6 hours and 12 minutes.  His residual AHI is 0.7 on 8 cm of water with EPR 2.  His leak in the 95th percentile is 3.6 L/min.  He reports that the CPAP is working well for him.  He denies any new issues.  He returns today for an evaluation.  REVIEW OF SYSTEMS: Out of a complete 14 system review of symptoms, the patient complains only of the following symptoms, and all other reviewed systems are negative.  FSS 25 ESS 3  ALLERGIES: Allergies  Allergen Reactions  . Amoxicillin Swelling  . Penicillins     HOME MEDICATIONS: Outpatient Medications Prior to Visit  Medication Sig Dispense Refill  . albuterol (PROVENTIL HFA;VENTOLIN HFA) 108 (90 Base) MCG/ACT inhaler Inhale into the lungs.    Marland Kitchen escitalopram (LEXAPRO) 10 MG tablet Take 10 mg by mouth every morning.    Marland Kitchen Fexofenadine HCl (ALLEGRA PO) Take by mouth as needed.    Marland Kitchen levothyroxine (SYNTHROID, LEVOTHROID) 75 MCG tablet Take 1 tablet (75 mcg total) by mouth daily.  Follow up for repeat testing in 2 months 90 tablet 0   No facility-administered medications prior to visit.    PAST MEDICAL HISTORY: Past Medical History:  Diagnosis Date  . Asthma   . High cholesterol   . Hyperthyroidism     PAST SURGICAL HISTORY: Past Surgical History:  Procedure Laterality Date  . MOUTH SURGERY  2014  . None      FAMILY HISTORY: Family History  Problem Relation Age of Onset  . Heart attack Paternal Grandfather   . Irritable bowel syndrome Mother   . Arthritis Mother     SOCIAL HISTORY: Social History   Socioeconomic History  . Marital status: Married    Spouse name: Not on file  . Number of children: 0  . Years of education: Masters  . Highest education level: Not on file  Occupational History  . Not on file  Tobacco Use  . Smoking status: Former Smoker    Quit date: 07/02/2013    Years since quitting: 6.8  . Smokeless tobacco: Never Used  Substance and Sexual Activity  . Alcohol use: Yes    Comment: 2 times a week   . Drug use: No  . Sexual activity: Not on file  Other Topics Concern  . Not on file  Social History Narrative  Patient lives at home home with girlfriend Merry Proud.   Patient has no children.    Masters in Audiological scientist    Patient is not married.    Patient is left handed.    Patient is currently working full time.    Social Determinants of Health   Financial Resource Strain: Not on file  Food Insecurity: Not on file  Transportation Needs: Not on file  Physical Activity: Not on file  Stress: Not on file  Social Connections: Not on file  Intimate Partner Violence: Not on file      PHYSICAL EXAM  There were no vitals filed for this visit. There is no height or weight on file to calculate BMI.  Generalized: Well developed, in no acute distress  Chest: Lungs clear to auscultation bilaterally  Neurological examination  Mentation: Alert oriented to time, place, history taking. Follows all commands speech and  language fluent Cranial nerve II-XII: Extraocular movements were full, visual field were full on confrontational test Head turning and shoulder shrug  were normal and symmetric. Motor: The motor testing reveals 5 over 5 strength of all 4 extremities. Good symmetric motor tone is noted throughout.  Sensory: Sensory testing is intact to soft touch on all 4 extremities. No evidence of extinction is noted.  Gait and station: Gait is normal.    DIAGNOSTIC DATA (LABS, IMAGING, TESTING) - I reviewed patient records, labs, notes, testing and imaging myself where available.  Lab Results  Component Value Date   WBC 7.6 04/24/2015   HGB 14.6 04/24/2015   HCT 42.0 (A) 04/24/2015   MCV 83.2 04/24/2015      Component Value Date/Time   NA 141 04/24/2015 0952   K 4.8 04/24/2015 0952   CL 99 04/24/2015 0952   CO2 23 04/24/2015 0952   GLUCOSE 86 04/24/2015 0952   BUN 12 04/24/2015 0952   CREATININE 0.81 04/24/2015 0952   CALCIUM 9.8 04/24/2015 0952   PROT 7.5 04/24/2015 0952   ALBUMIN 4.6 04/24/2015 0952   AST 41 (H) 04/24/2015 0952   ALT 61 (H) 04/24/2015 0952   ALKPHOS 61 04/24/2015 0952   BILITOT 0.5 04/24/2015 0952   Lab Results  Component Value Date   CHOL 227 (H) 04/24/2015   HDL 49 04/24/2015   LDLCALC 141 (H) 04/24/2015   TRIG 187 (H) 04/24/2015   CHOLHDL 4.6 04/24/2015   Lab Results  Component Value Date   HGBA1C 5.5 04/24/2015   No results found for: VITAMINB12 Lab Results  Component Value Date   TSH 4.655 (H) 04/24/2015      ASSESSMENT AND PLAN 34 y.o. year old male  has a past medical history of Asthma, High cholesterol, and Hyperthyroidism. here with:  1. OSA on CPAP  - CPAP compliance excellent - Good treatment of AHI  - Encourage patient to use CPAP nightly and > 4 hours each night - F/U in 1 year or sooner if needed   I spent 20 minutes of face-to-face and non-face-to-face time with patient.  This included previsit chart review, lab review, study review,  order entry, electronic health record documentation, patient education.  Butch Penny, MSN, NP-C 05/02/2020, 8:42 AM [facility] [address] [place], [place] [address] [phone]  I reviewed the above note and documentation by the Nurse Practitioner and agree with the history, exam, assessment and plan as outlined above. I was available for consultation. Huston Foley, MD, PhD [facility])

## 2020-06-23 DIAGNOSIS — Z20822 Contact with and (suspected) exposure to covid-19: Secondary | ICD-10-CM | POA: Diagnosis not present

## 2020-06-26 DIAGNOSIS — H6593 Unspecified nonsuppurative otitis media, bilateral: Secondary | ICD-10-CM | POA: Diagnosis not present

## 2020-07-10 DIAGNOSIS — E785 Hyperlipidemia, unspecified: Secondary | ICD-10-CM | POA: Diagnosis not present

## 2020-07-10 DIAGNOSIS — E291 Testicular hypofunction: Secondary | ICD-10-CM | POA: Diagnosis not present

## 2020-07-10 DIAGNOSIS — M109 Gout, unspecified: Secondary | ICD-10-CM | POA: Diagnosis not present

## 2020-07-10 DIAGNOSIS — E039 Hypothyroidism, unspecified: Secondary | ICD-10-CM | POA: Diagnosis not present

## 2020-07-10 DIAGNOSIS — E1169 Type 2 diabetes mellitus with other specified complication: Secondary | ICD-10-CM | POA: Diagnosis not present

## 2020-07-16 DIAGNOSIS — Z23 Encounter for immunization: Secondary | ICD-10-CM | POA: Diagnosis not present

## 2020-07-16 DIAGNOSIS — I10 Essential (primary) hypertension: Secondary | ICD-10-CM | POA: Diagnosis not present

## 2020-07-16 DIAGNOSIS — Z0000 Encounter for general adult medical examination without abnormal findings: Secondary | ICD-10-CM | POA: Diagnosis not present

## 2020-07-16 DIAGNOSIS — E039 Hypothyroidism, unspecified: Secondary | ICD-10-CM | POA: Diagnosis not present

## 2020-07-16 DIAGNOSIS — R82998 Other abnormal findings in urine: Secondary | ICD-10-CM | POA: Diagnosis not present

## 2020-11-21 DIAGNOSIS — F411 Generalized anxiety disorder: Secondary | ICD-10-CM | POA: Diagnosis not present

## 2020-11-21 DIAGNOSIS — F320 Major depressive disorder, single episode, mild: Secondary | ICD-10-CM | POA: Diagnosis not present

## 2020-11-21 DIAGNOSIS — Z0189 Encounter for other specified special examinations: Secondary | ICD-10-CM | POA: Diagnosis not present

## 2020-11-21 DIAGNOSIS — F4011 Social phobia, generalized: Secondary | ICD-10-CM | POA: Diagnosis not present

## 2020-11-26 DIAGNOSIS — I10 Essential (primary) hypertension: Secondary | ICD-10-CM | POA: Diagnosis not present

## 2020-11-26 DIAGNOSIS — Z043 Encounter for examination and observation following other accident: Secondary | ICD-10-CM | POA: Diagnosis not present

## 2020-11-26 DIAGNOSIS — Z713 Dietary counseling and surveillance: Secondary | ICD-10-CM | POA: Diagnosis not present

## 2020-11-27 DIAGNOSIS — F320 Major depressive disorder, single episode, mild: Secondary | ICD-10-CM | POA: Diagnosis not present

## 2020-11-27 DIAGNOSIS — F411 Generalized anxiety disorder: Secondary | ICD-10-CM | POA: Diagnosis not present

## 2020-11-27 DIAGNOSIS — F4011 Social phobia, generalized: Secondary | ICD-10-CM | POA: Diagnosis not present

## 2020-12-04 DIAGNOSIS — F320 Major depressive disorder, single episode, mild: Secondary | ICD-10-CM | POA: Diagnosis not present

## 2020-12-04 DIAGNOSIS — F4011 Social phobia, generalized: Secondary | ICD-10-CM | POA: Diagnosis not present

## 2020-12-04 DIAGNOSIS — F411 Generalized anxiety disorder: Secondary | ICD-10-CM | POA: Diagnosis not present

## 2020-12-09 DIAGNOSIS — Z20822 Contact with and (suspected) exposure to covid-19: Secondary | ICD-10-CM | POA: Diagnosis not present

## 2020-12-11 DIAGNOSIS — F411 Generalized anxiety disorder: Secondary | ICD-10-CM | POA: Diagnosis not present

## 2020-12-11 DIAGNOSIS — F320 Major depressive disorder, single episode, mild: Secondary | ICD-10-CM | POA: Diagnosis not present

## 2020-12-11 DIAGNOSIS — F4011 Social phobia, generalized: Secondary | ICD-10-CM | POA: Diagnosis not present

## 2020-12-18 DIAGNOSIS — F4011 Social phobia, generalized: Secondary | ICD-10-CM | POA: Diagnosis not present

## 2020-12-18 DIAGNOSIS — F411 Generalized anxiety disorder: Secondary | ICD-10-CM | POA: Diagnosis not present

## 2020-12-18 DIAGNOSIS — F320 Major depressive disorder, single episode, mild: Secondary | ICD-10-CM | POA: Diagnosis not present

## 2021-01-13 ENCOUNTER — Other Ambulatory Visit: Payer: Self-pay

## 2021-01-13 ENCOUNTER — Ambulatory Visit: Payer: Self-pay | Admitting: Family Medicine

## 2021-01-13 ENCOUNTER — Encounter: Payer: Self-pay | Admitting: Family Medicine

## 2021-01-13 DIAGNOSIS — Z113 Encounter for screening for infections with a predominantly sexual mode of transmission: Secondary | ICD-10-CM

## 2021-01-13 LAB — GRAM STAIN

## 2021-01-13 NOTE — Progress Notes (Signed)
Pt here for STD screening.  Gram stain results reviewed, no treatment required.  Pt declined condoms. Hayven Fatima M Leslee Suire, RN  

## 2021-01-13 NOTE — Progress Notes (Signed)
   [facility] Department STI clinic/screening visit  Subjective:  RADEK CARNERO is a 35 y.o. male being seen today for an STI screening visit. The patient reports they do not have symptoms.    Patient has the following medical conditions:   Patient Active Problem List   Diagnosis Date Noted   Closed fracture of sesamoid bone of left foot 05/08/2016   Paresthesia 09/27/2013   Obstructive sleep apnea 09/27/2013     Chief Complaint  Patient presents with   SEXUALLY TRANSMITTED DISEASE    Screening     HPI  Patient reports here for screening, denies s/x.    Does the patient or their partner desires a pregnancy in the next year? No  Screening for MPX risk: Does the patient have an unexplained rash? No Is the patient MSM? No Does the patient endorse multiple sex partners or anonymous sex partners? Yes Did the patient have close or sexual contact with a person diagnosed with MPX? No Has the patient traveled outside the [place] where MPX is endemic? No Is there a high clinical suspicion for MPX-- evidenced by one of the following No  -Unlikely to be chickenpox  -Lymphadenopathy  -Rash that present in same phase of evolution on any given body part   See flowsheet for further details and programmatic requirements.    The following portions of the patient's history were reviewed and updated as appropriate: allergies, current medications, past medical history, past social history, past surgical history and problem list.  Objective:  There were no vitals filed for this visit.  Physical Exam    Assessment and Plan:  ARTIST BLOOM is a 35 y.o. male presenting to the [facility] Department for STI screening  1. Screening examination for venereal disease Patient does not have STI symptoms Patient accepted all screenings including  gram stain, urethral GC and bloodwork for HIV/RPR.  Patient meets criteria for HepB screening? No. Ordered? No - does not  meet criteria  Patient meets criteria for HepC screening? No. Ordered? No - does not meet crtieria  Recommended condom use with all sex Discussed importance of condom use for STI prevent  Treat gram stain per standing order Discussed time line for [facility] results and that patient will be called with positive results and encouraged patient to call if he had not heard in 2 weeks Recommended returning for continued or worsening symptoms.  - HIV [facility] - Syphilis Serology, [facility] - Gonococcus culture - Gram stain - Gonococcus culture     Return for as needed.  Future Appointments  Date Time Provider Department Center  05/05/2021  9:00 AM Butch Penny, NP [facility] None    Wendi Snipes, FNP

## 2021-01-14 DIAGNOSIS — F4011 Social phobia, generalized: Secondary | ICD-10-CM | POA: Diagnosis not present

## 2021-01-14 DIAGNOSIS — F411 Generalized anxiety disorder: Secondary | ICD-10-CM | POA: Diagnosis not present

## 2021-01-14 DIAGNOSIS — F320 Major depressive disorder, single episode, mild: Secondary | ICD-10-CM | POA: Diagnosis not present

## 2021-01-18 LAB — GONOCOCCUS CULTURE

## 2021-01-21 DIAGNOSIS — F320 Major depressive disorder, single episode, mild: Secondary | ICD-10-CM | POA: Diagnosis not present

## 2021-01-21 DIAGNOSIS — F411 Generalized anxiety disorder: Secondary | ICD-10-CM | POA: Diagnosis not present

## 2021-01-21 DIAGNOSIS — F4011 Social phobia, generalized: Secondary | ICD-10-CM | POA: Diagnosis not present

## 2021-01-28 DIAGNOSIS — F4011 Social phobia, generalized: Secondary | ICD-10-CM | POA: Diagnosis not present

## 2021-01-28 DIAGNOSIS — F320 Major depressive disorder, single episode, mild: Secondary | ICD-10-CM | POA: Diagnosis not present

## 2021-01-28 DIAGNOSIS — F411 Generalized anxiety disorder: Secondary | ICD-10-CM | POA: Diagnosis not present

## 2021-02-04 DIAGNOSIS — F4011 Social phobia, generalized: Secondary | ICD-10-CM | POA: Diagnosis not present

## 2021-02-04 DIAGNOSIS — F411 Generalized anxiety disorder: Secondary | ICD-10-CM | POA: Diagnosis not present

## 2021-02-04 DIAGNOSIS — F320 Major depressive disorder, single episode, mild: Secondary | ICD-10-CM | POA: Diagnosis not present

## 2021-02-11 DIAGNOSIS — F320 Major depressive disorder, single episode, mild: Secondary | ICD-10-CM | POA: Diagnosis not present

## 2021-02-11 DIAGNOSIS — F411 Generalized anxiety disorder: Secondary | ICD-10-CM | POA: Diagnosis not present

## 2021-02-11 DIAGNOSIS — F4011 Social phobia, generalized: Secondary | ICD-10-CM | POA: Diagnosis not present

## 2021-02-18 DIAGNOSIS — F4011 Social phobia, generalized: Secondary | ICD-10-CM | POA: Diagnosis not present

## 2021-02-18 DIAGNOSIS — F320 Major depressive disorder, single episode, mild: Secondary | ICD-10-CM | POA: Diagnosis not present

## 2021-02-18 DIAGNOSIS — F411 Generalized anxiety disorder: Secondary | ICD-10-CM | POA: Diagnosis not present

## 2021-02-25 DIAGNOSIS — F411 Generalized anxiety disorder: Secondary | ICD-10-CM | POA: Diagnosis not present

## 2021-02-25 DIAGNOSIS — F4011 Social phobia, generalized: Secondary | ICD-10-CM | POA: Diagnosis not present

## 2021-02-25 DIAGNOSIS — F320 Major depressive disorder, single episode, mild: Secondary | ICD-10-CM | POA: Diagnosis not present

## 2021-03-04 DIAGNOSIS — F320 Major depressive disorder, single episode, mild: Secondary | ICD-10-CM | POA: Diagnosis not present

## 2021-03-04 DIAGNOSIS — F4011 Social phobia, generalized: Secondary | ICD-10-CM | POA: Diagnosis not present

## 2021-03-04 DIAGNOSIS — F411 Generalized anxiety disorder: Secondary | ICD-10-CM | POA: Diagnosis not present

## 2021-03-11 DIAGNOSIS — F411 Generalized anxiety disorder: Secondary | ICD-10-CM | POA: Diagnosis not present

## 2021-03-11 DIAGNOSIS — F320 Major depressive disorder, single episode, mild: Secondary | ICD-10-CM | POA: Diagnosis not present

## 2021-03-11 DIAGNOSIS — F4011 Social phobia, generalized: Secondary | ICD-10-CM | POA: Diagnosis not present

## 2021-03-18 DIAGNOSIS — F4011 Social phobia, generalized: Secondary | ICD-10-CM | POA: Diagnosis not present

## 2021-03-18 DIAGNOSIS — F411 Generalized anxiety disorder: Secondary | ICD-10-CM | POA: Diagnosis not present

## 2021-03-18 DIAGNOSIS — F320 Major depressive disorder, single episode, mild: Secondary | ICD-10-CM | POA: Diagnosis not present

## 2021-03-25 DIAGNOSIS — F4011 Social phobia, generalized: Secondary | ICD-10-CM | POA: Diagnosis not present

## 2021-03-25 DIAGNOSIS — F320 Major depressive disorder, single episode, mild: Secondary | ICD-10-CM | POA: Diagnosis not present

## 2021-03-25 DIAGNOSIS — F411 Generalized anxiety disorder: Secondary | ICD-10-CM | POA: Diagnosis not present

## 2021-04-01 DIAGNOSIS — F411 Generalized anxiety disorder: Secondary | ICD-10-CM | POA: Diagnosis not present

## 2021-04-01 DIAGNOSIS — F4011 Social phobia, generalized: Secondary | ICD-10-CM | POA: Diagnosis not present

## 2021-04-01 DIAGNOSIS — F320 Major depressive disorder, single episode, mild: Secondary | ICD-10-CM | POA: Diagnosis not present

## 2021-04-08 DIAGNOSIS — F411 Generalized anxiety disorder: Secondary | ICD-10-CM | POA: Diagnosis not present

## 2021-04-08 DIAGNOSIS — F4011 Social phobia, generalized: Secondary | ICD-10-CM | POA: Diagnosis not present

## 2021-04-08 DIAGNOSIS — F320 Major depressive disorder, single episode, mild: Secondary | ICD-10-CM | POA: Diagnosis not present

## 2021-04-15 DIAGNOSIS — F320 Major depressive disorder, single episode, mild: Secondary | ICD-10-CM | POA: Diagnosis not present

## 2021-04-15 DIAGNOSIS — F4011 Social phobia, generalized: Secondary | ICD-10-CM | POA: Diagnosis not present

## 2021-04-15 DIAGNOSIS — F411 Generalized anxiety disorder: Secondary | ICD-10-CM | POA: Diagnosis not present

## 2021-04-22 DIAGNOSIS — F320 Major depressive disorder, single episode, mild: Secondary | ICD-10-CM | POA: Diagnosis not present

## 2021-04-22 DIAGNOSIS — F411 Generalized anxiety disorder: Secondary | ICD-10-CM | POA: Diagnosis not present

## 2021-04-22 DIAGNOSIS — F4011 Social phobia, generalized: Secondary | ICD-10-CM | POA: Diagnosis not present

## 2021-04-30 DIAGNOSIS — F320 Major depressive disorder, single episode, mild: Secondary | ICD-10-CM | POA: Diagnosis not present

## 2021-04-30 DIAGNOSIS — F411 Generalized anxiety disorder: Secondary | ICD-10-CM | POA: Diagnosis not present

## 2021-04-30 DIAGNOSIS — F4011 Social phobia, generalized: Secondary | ICD-10-CM | POA: Diagnosis not present

## 2021-05-01 DIAGNOSIS — E039 Hypothyroidism, unspecified: Secondary | ICD-10-CM | POA: Diagnosis not present

## 2021-05-01 DIAGNOSIS — F411 Generalized anxiety disorder: Secondary | ICD-10-CM | POA: Diagnosis not present

## 2021-05-01 DIAGNOSIS — I10 Essential (primary) hypertension: Secondary | ICD-10-CM | POA: Diagnosis not present

## 2021-05-01 DIAGNOSIS — E119 Type 2 diabetes mellitus without complications: Secondary | ICD-10-CM | POA: Diagnosis not present

## 2021-05-05 ENCOUNTER — Ambulatory Visit: Payer: BC Managed Care – PPO | Admitting: Adult Health

## 2021-05-06 DIAGNOSIS — F320 Major depressive disorder, single episode, mild: Secondary | ICD-10-CM | POA: Diagnosis not present

## 2021-05-06 DIAGNOSIS — F411 Generalized anxiety disorder: Secondary | ICD-10-CM | POA: Diagnosis not present

## 2021-05-06 DIAGNOSIS — F4011 Social phobia, generalized: Secondary | ICD-10-CM | POA: Diagnosis not present

## 2021-05-20 DIAGNOSIS — F320 Major depressive disorder, single episode, mild: Secondary | ICD-10-CM | POA: Diagnosis not present

## 2021-05-20 DIAGNOSIS — F4011 Social phobia, generalized: Secondary | ICD-10-CM | POA: Diagnosis not present

## 2021-05-20 DIAGNOSIS — F411 Generalized anxiety disorder: Secondary | ICD-10-CM | POA: Diagnosis not present

## 2021-05-27 DIAGNOSIS — F4011 Social phobia, generalized: Secondary | ICD-10-CM | POA: Diagnosis not present

## 2021-05-27 DIAGNOSIS — F320 Major depressive disorder, single episode, mild: Secondary | ICD-10-CM | POA: Diagnosis not present

## 2021-05-27 DIAGNOSIS — Z0189 Encounter for other specified special examinations: Secondary | ICD-10-CM | POA: Diagnosis not present

## 2021-05-27 DIAGNOSIS — F411 Generalized anxiety disorder: Secondary | ICD-10-CM | POA: Diagnosis not present

## 2021-06-03 DIAGNOSIS — F4011 Social phobia, generalized: Secondary | ICD-10-CM | POA: Diagnosis not present

## 2021-06-03 DIAGNOSIS — F411 Generalized anxiety disorder: Secondary | ICD-10-CM | POA: Diagnosis not present

## 2021-06-03 DIAGNOSIS — F320 Major depressive disorder, single episode, mild: Secondary | ICD-10-CM | POA: Diagnosis not present

## 2021-06-10 DIAGNOSIS — F411 Generalized anxiety disorder: Secondary | ICD-10-CM | POA: Diagnosis not present

## 2021-06-10 DIAGNOSIS — F4011 Social phobia, generalized: Secondary | ICD-10-CM | POA: Diagnosis not present

## 2021-06-10 DIAGNOSIS — F320 Major depressive disorder, single episode, mild: Secondary | ICD-10-CM | POA: Diagnosis not present

## 2021-06-17 DIAGNOSIS — F4011 Social phobia, generalized: Secondary | ICD-10-CM | POA: Diagnosis not present

## 2021-06-17 DIAGNOSIS — F411 Generalized anxiety disorder: Secondary | ICD-10-CM | POA: Diagnosis not present

## 2021-06-17 DIAGNOSIS — F320 Major depressive disorder, single episode, mild: Secondary | ICD-10-CM | POA: Diagnosis not present

## 2021-06-24 DIAGNOSIS — F411 Generalized anxiety disorder: Secondary | ICD-10-CM | POA: Diagnosis not present

## 2021-06-24 DIAGNOSIS — F320 Major depressive disorder, single episode, mild: Secondary | ICD-10-CM | POA: Diagnosis not present

## 2021-06-24 DIAGNOSIS — F4011 Social phobia, generalized: Secondary | ICD-10-CM | POA: Diagnosis not present

## 2021-07-01 DIAGNOSIS — F4011 Social phobia, generalized: Secondary | ICD-10-CM | POA: Diagnosis not present

## 2021-07-01 DIAGNOSIS — F320 Major depressive disorder, single episode, mild: Secondary | ICD-10-CM | POA: Diagnosis not present

## 2021-07-01 DIAGNOSIS — F411 Generalized anxiety disorder: Secondary | ICD-10-CM | POA: Diagnosis not present

## 2021-07-09 DIAGNOSIS — F320 Major depressive disorder, single episode, mild: Secondary | ICD-10-CM | POA: Diagnosis not present

## 2021-07-09 DIAGNOSIS — F411 Generalized anxiety disorder: Secondary | ICD-10-CM | POA: Diagnosis not present

## 2021-07-09 DIAGNOSIS — F4011 Social phobia, generalized: Secondary | ICD-10-CM | POA: Diagnosis not present

## 2021-07-15 DIAGNOSIS — F411 Generalized anxiety disorder: Secondary | ICD-10-CM | POA: Diagnosis not present

## 2021-07-15 DIAGNOSIS — F320 Major depressive disorder, single episode, mild: Secondary | ICD-10-CM | POA: Diagnosis not present

## 2021-07-15 DIAGNOSIS — F4011 Social phobia, generalized: Secondary | ICD-10-CM | POA: Diagnosis not present

## 2021-07-21 DIAGNOSIS — Z0189 Encounter for other specified special examinations: Secondary | ICD-10-CM | POA: Diagnosis not present

## 2021-07-22 DIAGNOSIS — F411 Generalized anxiety disorder: Secondary | ICD-10-CM | POA: Diagnosis not present

## 2021-07-22 DIAGNOSIS — F4011 Social phobia, generalized: Secondary | ICD-10-CM | POA: Diagnosis not present

## 2021-07-22 DIAGNOSIS — F320 Major depressive disorder, single episode, mild: Secondary | ICD-10-CM | POA: Diagnosis not present

## 2021-07-29 DIAGNOSIS — F320 Major depressive disorder, single episode, mild: Secondary | ICD-10-CM | POA: Diagnosis not present

## 2021-07-29 DIAGNOSIS — F411 Generalized anxiety disorder: Secondary | ICD-10-CM | POA: Diagnosis not present

## 2021-07-29 DIAGNOSIS — F4011 Social phobia, generalized: Secondary | ICD-10-CM | POA: Diagnosis not present

## 2021-07-30 DIAGNOSIS — E785 Hyperlipidemia, unspecified: Secondary | ICD-10-CM | POA: Diagnosis not present

## 2021-07-30 DIAGNOSIS — Z0000 Encounter for general adult medical examination without abnormal findings: Secondary | ICD-10-CM | POA: Diagnosis not present

## 2021-07-30 DIAGNOSIS — E039 Hypothyroidism, unspecified: Secondary | ICD-10-CM | POA: Diagnosis not present

## 2021-07-30 DIAGNOSIS — E119 Type 2 diabetes mellitus without complications: Secondary | ICD-10-CM | POA: Diagnosis not present

## 2021-07-30 DIAGNOSIS — I10 Essential (primary) hypertension: Secondary | ICD-10-CM | POA: Diagnosis not present

## 2021-07-30 DIAGNOSIS — F17290 Nicotine dependence, other tobacco product, uncomplicated: Secondary | ICD-10-CM | POA: Diagnosis not present

## 2021-08-05 DIAGNOSIS — F411 Generalized anxiety disorder: Secondary | ICD-10-CM | POA: Diagnosis not present

## 2021-08-05 DIAGNOSIS — F4011 Social phobia, generalized: Secondary | ICD-10-CM | POA: Diagnosis not present

## 2021-08-05 DIAGNOSIS — F320 Major depressive disorder, single episode, mild: Secondary | ICD-10-CM | POA: Diagnosis not present

## 2021-08-06 ENCOUNTER — Telehealth: Payer: Self-pay | Admitting: Adult Health

## 2021-08-06 NOTE — Telephone Encounter (Signed)
..   Pt understands that although there may be some limitations with this type of visit, we will take all precautions to reduce any security or privacy concerns.  Pt understands that this will be treated like an in office visit and we will file with pt's insurance, and there may be a patient responsible charge related to this service. ? ?

## 2021-08-11 ENCOUNTER — Telehealth: Payer: BC Managed Care – PPO | Admitting: Adult Health

## 2021-08-11 DIAGNOSIS — Z9989 Dependence on other enabling machines and devices: Secondary | ICD-10-CM

## 2021-08-11 DIAGNOSIS — G4733 Obstructive sleep apnea (adult) (pediatric): Secondary | ICD-10-CM | POA: Diagnosis not present

## 2021-08-11 NOTE — Progress Notes (Signed)
? ? ? ?PATIENT: Jeffery Soto ?DOB: Aug 14, 1985 ? ?REASON FOR VISIT: follow up ?HISTORY FROM: patient ?PRIMARY NEUROLOGIST:  ? ?Virtual Visit via Video Note ? ?I connected with Jeffery Soto on 08/11/21 at  1:00 PM EDT by a video enabled telemedicine application located remotely at [facility] and verified that I am speaking with the correct person using two identifiers who was located at their own home. ?  ?I discussed the limitations of evaluation and management by telemedicine and the availability of in person appointments. The patient expressed understanding and agreed to proceed. ? ? ?PATIENT: Jeffery Soto ?DOB: [birth year] ? ?REASON FOR VISIT: follow up ?HISTORY FROM: patient ? ?HISTORY OF PRESENT ILLNESS: ?Today 08/11/21: ? ?Jeffery Soto is a 36 year old male with a history of obstructive sleep apnea on CPAP.  He returns today for follow-up.  He reports that the CPAP is working well for him.  He states that he does not like to sleep without it.  He does have the nasal mask.  Any nights that he does not use it is usually due to nasal congestion. ? ? ? ?REVIEW OF SYSTEMS: Out of a complete 14 system review of symptoms, the patient complains only of the following symptoms, and all other reviewed systems are negative. ? ?ALLERGIES: ?Allergies  ?Allergen Reactions  ? Amoxicillin Swelling  ? Penicillins   ? ? ?HOME MEDICATIONS: ?Outpatient Medications Prior to Visit  ?Medication Sig Dispense Refill  ? albuterol (PROVENTIL HFA;VENTOLIN HFA) 108 (90 Base) MCG/ACT inhaler Inhale into the lungs. (Patient not taking: Reported on 01/13/2021)    ? escitalopram (LEXAPRO) 10 MG tablet Take 10 mg by mouth every morning.    ? Fexofenadine HCl (ALLEGRA PO) Take by mouth as needed.    ? levothyroxine (SYNTHROID, LEVOTHROID) 75 MCG tablet Take 1 tablet (75 mcg total) by mouth daily. Follow up for repeat testing in 2 months 90 tablet 0  ? ?No facility-administered medications prior to visit.  ? ? ?PAST  MEDICAL HISTORY: ?Past Medical History:  ?Diagnosis Date  ? Asthma   ? High cholesterol   ? Hyperthyroidism   ? ? ?PAST SURGICAL HISTORY: ?Past Surgical History:  ?Procedure Laterality Date  ? MOUTH SURGERY  2014  ? None    ? ? ?FAMILY HISTORY: ?Family History  ?Problem Relation Age of Onset  ? Heart attack Paternal Grandfather   ? Irritable bowel syndrome Mother   ? Arthritis Mother   ? ? ?SOCIAL HISTORY: ?Social History  ? ?Socioeconomic History  ? Marital status: Married  ?  Spouse name: Not on file  ? Number of children: 0  ? Years of education: Masters  ? Highest education level: Not on file  ?Occupational History  ? Not on file  ?Tobacco Use  ? Smoking status: Some Days  ?  Types: Cigarettes, Cigars  ? Smokeless tobacco: Never  ? Tobacco comments:  ?   Two cigars, or cigs per week.    ?Vaping Use  ? Vaping Use: Some days  ? Substances: Nicotine, Flavoring  ?Substance and Sexual Activity  ? Alcohol use: Yes  ?  Comment: occasionally  ? Drug use: Yes  ?  Types: Marijuana  ?  Comment: delta 8 gummies  ? Sexual activity: Yes  ?  Birth control/protection: Condom  ?Other Topics Concern  ? Not on file  ?Social History Narrative  ? Not on file  ? ?Social Determinants of Health  ? ?Financial Resource Strain: Not on file  ?Food Insecurity:  Not on file  ?Transportation Needs: Not on file  ?Physical Activity: Not on file  ?Stress: Not on file  ?Social Connections: Not on file  ?Intimate Partner Violence: Not At Risk  ? Fear of Current or Ex-Partner: No  ? Emotionally Abused: No  ? Physically Abused: No  ? Sexually Abused: No  ? ? ? ? ?PHYSICAL EXAM ?Generalized: Well developed, in no acute distress  ? ?Neurological examination  ?Mentation: Alert oriented to time, place, history taking. Follows all commands speech and language fluent ?Cranial nerve II-XII:Extraocular movements were full. Facial symmetry noted. uvula tongue midline. Head turning and shoulder shrug  were normal and symmetric. ?Motor: Good strength throughout  subjectively per patient ?Sensory: Sensory testing is intact to soft touch on all 4 extremities subjectively per patient ?Coordination: Cerebellar testing reveals good finger-nose-finger  ?Gait and station: Patient is able to stand from a seated position. gait is normal.  ?Reflexes: UTA ? ?DIAGNOSTIC DATA (LABS, IMAGING, TESTING) ?- I reviewed patient records, labs, notes, testing and imaging myself where available. ? ?Lab Results  ?Component Value Date  ? WBC 7.6 04/24/2015  ? HGB 14.6 04/24/2015  ? HCT 42.0 (A) 04/24/2015  ? MCV 83.2 04/24/2015  ? ?   ?Component Value Date/Time  ? NA 141 04/24/2015 0952  ? K 4.8 04/24/2015 0952  ? CL 99 04/24/2015 0952  ? CO2 23 04/24/2015 0952  ? GLUCOSE 86 04/24/2015 0952  ? BUN 12 04/24/2015 0952  ? CREATININE 0.81 04/24/2015 0952  ? CALCIUM 9.8 04/24/2015 0952  ? PROT 7.5 04/24/2015 0952  ? ALBUMIN 4.6 04/24/2015 0952  ? AST 41 (H) 04/24/2015 8469  ? ALT 61 (H) 04/24/2015 6295  ? ALKPHOS 61 04/24/2015 0952  ? BILITOT 0.5 04/24/2015 0952  ? ?Lab Results  ?Component Value Date  ? CHOL 227 (H) 04/24/2015  ? HDL 49 04/24/2015  ? LDLCALC 141 (H) 04/24/2015  ? TRIG 187 (H) 04/24/2015  ? CHOLHDL 4.6 04/24/2015  ? ?Lab Results  ?Component Value Date  ? HGBA1C 5.5 04/24/2015  ? ?No results found for: VITAMINB12 ?Lab Results  ?Component Value Date  ? TSH 4.655 (H) 04/24/2015  ? ? ? ? ?ASSESSMENT AND PLAN ?36 y.o. year old male  has a past medical history of Asthma, High cholesterol, and Hyperthyroidism. here with: ? ?OSA on CPAP ? ?CPAP compliance excellent ?Residual AHI is good ?Encouraged patient to continue using CPAP nightly and > 4 hours each night ?F/U in 1 year or sooner if needed ? ? ?Butch Penny, MSN, NP-C 08/11/2021, 1:04 PM ?[facility] ?[address], [address] ?[place], [place] [address] ?[phone] ? ?

## 2021-08-12 DIAGNOSIS — F320 Major depressive disorder, single episode, mild: Secondary | ICD-10-CM | POA: Diagnosis not present

## 2021-08-12 DIAGNOSIS — F411 Generalized anxiety disorder: Secondary | ICD-10-CM | POA: Diagnosis not present

## 2021-08-12 DIAGNOSIS — F4011 Social phobia, generalized: Secondary | ICD-10-CM | POA: Diagnosis not present

## 2021-08-19 DIAGNOSIS — F320 Major depressive disorder, single episode, mild: Secondary | ICD-10-CM | POA: Diagnosis not present

## 2021-08-19 DIAGNOSIS — F411 Generalized anxiety disorder: Secondary | ICD-10-CM | POA: Diagnosis not present

## 2021-08-19 DIAGNOSIS — F4011 Social phobia, generalized: Secondary | ICD-10-CM | POA: Diagnosis not present

## 2021-08-26 DIAGNOSIS — F320 Major depressive disorder, single episode, mild: Secondary | ICD-10-CM | POA: Diagnosis not present

## 2021-08-26 DIAGNOSIS — F411 Generalized anxiety disorder: Secondary | ICD-10-CM | POA: Diagnosis not present

## 2021-08-26 DIAGNOSIS — F4011 Social phobia, generalized: Secondary | ICD-10-CM | POA: Diagnosis not present

## 2021-09-02 DIAGNOSIS — F4011 Social phobia, generalized: Secondary | ICD-10-CM | POA: Diagnosis not present

## 2021-09-02 DIAGNOSIS — F411 Generalized anxiety disorder: Secondary | ICD-10-CM | POA: Diagnosis not present

## 2021-09-02 DIAGNOSIS — F320 Major depressive disorder, single episode, mild: Secondary | ICD-10-CM | POA: Diagnosis not present

## 2021-09-09 DIAGNOSIS — F320 Major depressive disorder, single episode, mild: Secondary | ICD-10-CM | POA: Diagnosis not present

## 2021-09-09 DIAGNOSIS — F4011 Social phobia, generalized: Secondary | ICD-10-CM | POA: Diagnosis not present

## 2021-09-09 DIAGNOSIS — F411 Generalized anxiety disorder: Secondary | ICD-10-CM | POA: Diagnosis not present

## 2021-09-16 DIAGNOSIS — F411 Generalized anxiety disorder: Secondary | ICD-10-CM | POA: Diagnosis not present

## 2021-09-16 DIAGNOSIS — F4011 Social phobia, generalized: Secondary | ICD-10-CM | POA: Diagnosis not present

## 2021-09-16 DIAGNOSIS — F320 Major depressive disorder, single episode, mild: Secondary | ICD-10-CM | POA: Diagnosis not present

## 2022-01-27 ENCOUNTER — Other Ambulatory Visit (HOSPITAL_BASED_OUTPATIENT_CLINIC_OR_DEPARTMENT_OTHER): Payer: Self-pay

## 2022-01-27 MED ORDER — LEVOTHYROXINE SODIUM 75 MCG PO TABS
75.0000 ug | ORAL_TABLET | Freq: Every morning | ORAL | 0 refills | Status: DC
  Filled 2022-01-27: qty 30, 30d supply, fill #0

## 2022-01-27 MED ORDER — GLYXAMBI 25-5 MG PO TABS
1.0000 | ORAL_TABLET | Freq: Every day | ORAL | 3 refills | Status: AC
  Filled 2022-01-27: qty 30, 30d supply, fill #0

## 2022-01-28 ENCOUNTER — Other Ambulatory Visit (HOSPITAL_BASED_OUTPATIENT_CLINIC_OR_DEPARTMENT_OTHER): Payer: Self-pay

## 2022-03-26 ENCOUNTER — Other Ambulatory Visit (HOSPITAL_BASED_OUTPATIENT_CLINIC_OR_DEPARTMENT_OTHER): Payer: Self-pay

## 2022-03-26 MED ORDER — LEVOTHYROXINE SODIUM 75 MCG PO TABS
ORAL_TABLET | ORAL | 0 refills | Status: DC
  Filled 2022-03-26: qty 30, 30d supply, fill #0

## 2022-03-26 MED ORDER — ESCITALOPRAM OXALATE 10 MG PO TABS
10.0000 mg | ORAL_TABLET | ORAL | 3 refills | Status: AC
  Filled 2022-03-26: qty 90, 90d supply, fill #0

## 2022-05-21 ENCOUNTER — Other Ambulatory Visit (HOSPITAL_BASED_OUTPATIENT_CLINIC_OR_DEPARTMENT_OTHER): Payer: Self-pay

## 2022-05-21 MED ORDER — ALBUTEROL SULFATE HFA 108 (90 BASE) MCG/ACT IN AERS
INHALATION_SPRAY | RESPIRATORY_TRACT | 0 refills | Status: AC
  Filled 2022-05-21 (×2): qty 6.7, 25d supply, fill #0

## 2022-05-21 MED ORDER — PREDNISONE 10 MG PO TABS
ORAL_TABLET | ORAL | 0 refills | Status: AC
  Filled 2022-05-21: qty 21, 6d supply, fill #0

## 2022-05-21 MED ORDER — DOXYCYCLINE MONOHYDRATE 100 MG PO TABS
ORAL_TABLET | ORAL | 0 refills | Status: AC
  Filled 2022-05-21: qty 14, 7d supply, fill #0

## 2022-06-08 ENCOUNTER — Other Ambulatory Visit (HOSPITAL_BASED_OUTPATIENT_CLINIC_OR_DEPARTMENT_OTHER): Payer: Self-pay

## 2022-06-08 MED ORDER — MOUNJARO 7.5 MG/0.5ML ~~LOC~~ SOAJ
7.5000 mg | SUBCUTANEOUS | 3 refills | Status: AC
  Filled 2022-06-08: qty 2, 28d supply, fill #0

## 2022-06-08 MED ORDER — AZITHROMYCIN 250 MG PO TABS
ORAL_TABLET | ORAL | 0 refills | Status: AC
  Filled 2022-06-08: qty 6, 5d supply, fill #0

## 2022-06-08 MED ORDER — LEVOTHYROXINE SODIUM 75 MCG PO TABS
75.0000 ug | ORAL_TABLET | Freq: Every day | ORAL | 3 refills | Status: AC
  Filled 2022-06-08: qty 90, 90d supply, fill #0

## 2022-06-08 MED ORDER — PREDNISONE 20 MG PO TABS
20.0000 mg | ORAL_TABLET | ORAL | 0 refills | Status: AC
  Filled 2022-06-08: qty 12, 6d supply, fill #0

## 2022-06-08 MED ORDER — MOUNJARO 2.5 MG/0.5ML ~~LOC~~ SOAJ
2.5000 mg | SUBCUTANEOUS | 0 refills | Status: DC
  Filled 2022-06-08: qty 2, 28d supply, fill #0

## 2022-06-08 MED ORDER — MOUNJARO 5 MG/0.5ML ~~LOC~~ SOAJ
5.0000 mg | SUBCUTANEOUS | 0 refills | Status: AC
  Filled 2022-06-08: qty 2, 28d supply, fill #0

## 2022-06-13 ENCOUNTER — Other Ambulatory Visit (HOSPITAL_BASED_OUTPATIENT_CLINIC_OR_DEPARTMENT_OTHER): Payer: Self-pay

## 2022-06-15 ENCOUNTER — Other Ambulatory Visit (HOSPITAL_BASED_OUTPATIENT_CLINIC_OR_DEPARTMENT_OTHER): Payer: Self-pay

## 2022-06-17 ENCOUNTER — Other Ambulatory Visit (HOSPITAL_BASED_OUTPATIENT_CLINIC_OR_DEPARTMENT_OTHER): Payer: Self-pay

## 2022-06-18 ENCOUNTER — Other Ambulatory Visit (HOSPITAL_BASED_OUTPATIENT_CLINIC_OR_DEPARTMENT_OTHER): Payer: Self-pay

## 2022-07-01 ENCOUNTER — Other Ambulatory Visit (HOSPITAL_BASED_OUTPATIENT_CLINIC_OR_DEPARTMENT_OTHER): Payer: Self-pay

## 2022-07-01 MED ORDER — METFORMIN HCL ER 500 MG PO TB24
1000.0000 mg | ORAL_TABLET | Freq: Every day | ORAL | 11 refills | Status: AC
  Filled 2022-07-01: qty 60, 30d supply, fill #0

## 2022-07-03 ENCOUNTER — Other Ambulatory Visit (HOSPITAL_BASED_OUTPATIENT_CLINIC_OR_DEPARTMENT_OTHER): Payer: Self-pay

## 2022-07-14 ENCOUNTER — Other Ambulatory Visit (HOSPITAL_BASED_OUTPATIENT_CLINIC_OR_DEPARTMENT_OTHER): Payer: Self-pay

## 2022-07-14 MED ORDER — MOUNJARO 2.5 MG/0.5ML ~~LOC~~ SOAJ
2.5000 mg | SUBCUTANEOUS | 0 refills | Status: DC
  Filled 2022-07-14: qty 2, 28d supply, fill #0

## 2022-07-14 MED ORDER — ESCITALOPRAM OXALATE 10 MG PO TABS
ORAL_TABLET | ORAL | 3 refills | Status: AC
  Filled 2022-07-14: qty 90, 90d supply, fill #0
  Filled 2022-11-06: qty 90, 90d supply, fill #1

## 2022-08-04 ENCOUNTER — Other Ambulatory Visit (HOSPITAL_BASED_OUTPATIENT_CLINIC_OR_DEPARTMENT_OTHER): Payer: Self-pay

## 2022-08-04 MED ORDER — METFORMIN HCL ER 500 MG PO TB24
1000.0000 mg | ORAL_TABLET | Freq: Every day | ORAL | 11 refills | Status: AC
  Filled 2022-08-04: qty 60, 30d supply, fill #0

## 2022-08-10 ENCOUNTER — Other Ambulatory Visit (HOSPITAL_BASED_OUTPATIENT_CLINIC_OR_DEPARTMENT_OTHER): Payer: Self-pay

## 2022-08-10 MED ORDER — MOUNJARO 2.5 MG/0.5ML ~~LOC~~ SOAJ
2.5000 mg | SUBCUTANEOUS | 0 refills | Status: AC
  Filled 2022-08-10: qty 2, 28d supply, fill #0

## 2022-08-24 ENCOUNTER — Encounter: Payer: Self-pay | Admitting: *Deleted

## 2022-08-25 ENCOUNTER — Telehealth (INDEPENDENT_AMBULATORY_CARE_PROVIDER_SITE_OTHER): Payer: No Typology Code available for payment source | Admitting: Adult Health

## 2022-08-25 DIAGNOSIS — G4733 Obstructive sleep apnea (adult) (pediatric): Secondary | ICD-10-CM

## 2022-08-25 NOTE — Progress Notes (Signed)
PATIENT: Jeffery Soto DOB: 1986-05-07  REASON FOR VISIT: follow up HISTORY FROM: patient PRIMARY NEUROLOGIST: Dr. Frances FurbishAthar   Virtual Visit via Video Note  I connected with Jeffery Soto on 08/25/22 at  1:00 PM EDT by a video enabled telemedicine application [facility] and verified that I am speaking with the correct person using two identifiers who was located [place].    I discussed the limitations of evaluation and management by telemedicine and the availability of in person appointments. The patient expressed understanding and agreed to proceed.   PATIENT: Jeffery Soto DOB: 1986-05-07  REASON FOR VISIT: follow up HISTORY FROM: patient  HISTORY OF PRESENT ILLNESS: Today 08/25/22:  Jeffery Soto is a 37 y.o. male with a history of OSA on CPAP. Returns today for follow-up.  States that he loves his machine.  Continues to find it beneficial.  Denies any new issues.        08/08/21:Ms. Jeffery Soto is a 37 year old male with a history of obstructive sleep apnea on CPAP.  He returns today for follow-up.  He reports that the CPAP is working well for him.  He states that he does not like to sleep without it.  He does have the nasal mask.  Any nights that he does not use it is usually due to nasal congestion.    REVIEW OF SYSTEMS: Out of a complete 14 system review of symptoms, the patient complains only of the following symptoms, and all other reviewed systems are negative.  ALLERGIES: Allergies  Allergen Reactions   Amoxicillin Swelling   Penicillins     HOME MEDICATIONS: Outpatient Medications Prior to Visit  Medication Sig Dispense Refill   albuterol (PROVENTIL HFA;VENTOLIN HFA) 108 (90 Base) MCG/ACT inhaler Inhale into the lungs. (Patient not taking: Reported on 01/13/2021)     albuterol (VENTOLIN HFA) 108 (90 Base) MCG/ACT inhaler Inhale 2 puffs into the lungs every 6 (six) hours as needed for wheezing. 6.7 g 0   azithromycin  (ZITHROMAX) 250 MG tablet Take 2 tablets by mouth on day 1, then 1 tablet by mouth daily for 4 days 6 tablet 0   doxycycline (ADOXA) 100 MG tablet Take 1 tablet (100 mg total) by mouth in the morning and 1 tablet (100 mg total) in the evening. Take with a full glass of water and do not lie down for at least 30 minutes after. 14 tablet 0   escitalopram (LEXAPRO) 10 MG tablet Take 20 mg by mouth every morning.     escitalopram (LEXAPRO) 10 MG tablet Take 1 tablet (10 mg total) by mouth every morning. 90 tablet 3   escitalopram (LEXAPRO) 10 MG tablet Take 1 tablet (10 mg total) by mouth every morning 90 tablet 3   Fexofenadine HCl (ALLEGRA PO) Take by mouth as needed.     GLYXAMBI 25-5 MG TABS Take 1 tablet by mouth once daily 90 tablet 3   levothyroxine (SYNTHROID) 75 MCG tablet Take 1 tablet (75 mcg total) by mouth once daily. Take on an empty stomach with a glass of water at least 30-60 minutes before breakfast. 90 tablet 3   levothyroxine (SYNTHROID, LEVOTHROID) 75 MCG tablet Take 1 tablet (75 mcg total) by mouth daily. Follow up for repeat testing in 2 months 90 tablet 0   metFORMIN (GLUCOPHAGE-XR) 500 MG 24 hr tablet Take 2 tablets (1,000 mg total) by mouth daily with supper. 60 tablet 11   metFORMIN (GLUCOPHAGE-XR) 500 MG 24 hr  tablet Take 2 tablets (1,000 mg total) by mouth daily with dinner 60 tablet 11   predniSONE (DELTASONE) 10 MG tablet Take as directed on attached sheet. 21 tablet 0   predniSONE (DELTASONE) 20 MG tablet Take 3 tablets (60 mg total) by mouth once daily for 2 days, THEN 2 tablets (40 mg total) once daily for 2 days, THEN 1 tablet (20 mg total) once daily for 2 days. 12 tablet 0   tirzepatide (MOUNJARO) 2.5 MG/0.5ML Pen Inject 2.5 mg into the skin once a week. 2 mL 0   tirzepatide (MOUNJARO) 5 MG/0.5ML Pen Inject 0.5 mLs (5 mg total) subcutaneously every 7 (seven) days 2 mL 0   tirzepatide (MOUNJARO) 7.5 MG/0.5ML Pen Inject 0.5 mLs (7.5 mg total) subcutaneously every 7 (seven)  days 2 mL 3   No facility-administered medications prior to visit.    PAST MEDICAL HISTORY: Past Medical History:  Diagnosis Date   Asthma    High cholesterol    Hyperthyroidism     PAST SURGICAL HISTORY: Past Surgical History:  Procedure Laterality Date   MOUTH SURGERY  2014   None      FAMILY HISTORY: Family History  Problem Relation Age of Onset   Heart attack Paternal Grandfather    Irritable bowel syndrome Mother    Arthritis Mother     SOCIAL HISTORY: Social History   Socioeconomic History   Marital status: Married    Spouse name: Not on file   Number of children: 0   Years of education: Masters   Highest education level: Not on file  Occupational History   Not on file  Tobacco Use   Smoking status: Some Days    Types: Cigarettes, Cigars   Smokeless tobacco: Never   Tobacco comments:     Two cigars, or cigs per week.    Vaping Use   Vaping Use: Some days   Substances: Nicotine, Flavoring  Substance and Sexual Activity   Alcohol use: Yes    Comment: occasionally   Drug use: Yes    Types: Marijuana    Comment: delta 8 gummies   Sexual activity: Yes    Birth control/protection: Condom  Other Topics Concern   Not on file  Social History Narrative   Not on file   Social Determinants of Health   Financial Resource Strain: Not on file  Food Insecurity: Not on file  Transportation Needs: Not on file  Physical Activity: Not on file  Stress: Not on file  Social Connections: Not on file  Intimate Partner Violence: Not At Risk (01/13/2021)   Humiliation, Afraid, Rape, and Kick questionnaire    Fear of Current or Ex-Partner: No    Emotionally Abused: No    Physically Abused: No    Sexually Abused: No      PHYSICAL EXAM Generalized: Well developed, in no acute distress   Neurological examination  Mentation: Alert oriented to time, place, history taking. Follows all commands speech and language fluent Cranial nerve II-XII: Facial symmetry  noted  DIAGNOSTIC DATA (LABS, IMAGING, TESTING) - I reviewed patient records, labs, notes, testing and imaging myself where available.  Lab Results  Component Value Date   WBC 7.6 04/24/2015   HGB 14.6 04/24/2015   HCT 42.0 (A) 04/24/2015   MCV 83.2 04/24/2015      Component Value Date/Time   NA 141 04/24/2015 0952   K 4.8 04/24/2015 0952   CL 99 04/24/2015 0952   CO2 23 04/24/2015 0952   GLUCOSE 86  04/24/2015 0952   BUN 12 04/24/2015 0952   CREATININE 0.81 04/24/2015 0952   CALCIUM 9.8 04/24/2015 0952   PROT 7.5 04/24/2015 0952   ALBUMIN 4.6 04/24/2015 0952   AST 41 (H) 04/24/2015 0952   ALT 61 (H) 04/24/2015 0952   ALKPHOS 61 04/24/2015 0952   BILITOT 0.5 04/24/2015 0952   Lab Results  Component Value Date   CHOL 227 (H) 04/24/2015   HDL 49 04/24/2015   LDLCALC 141 (H) 04/24/2015   TRIG 187 (H) 04/24/2015   CHOLHDL 4.6 04/24/2015   Lab Results  Component Value Date   HGBA1C 5.5 04/24/2015   No results found for: "VITAMINB12" Lab Results  Component Value Date   TSH 4.655 (H) 04/24/2015      ASSESSMENT AND PLAN 37 y.o. year old male  has a past medical history of Asthma, High cholesterol, and Hyperthyroidism. here with:  OSA on CPAP  CPAP compliance excellent Residual AHI is good Encouraged patient to continue using CPAP nightly and > 4 hours each night F/U in 1 year or sooner if needed   Butch Penny, MSN, NP-C 08/25/2022, 1:06 PM [facility] [address], [address] [place], [place] [address] [phone]

## 2022-09-07 ENCOUNTER — Other Ambulatory Visit (HOSPITAL_BASED_OUTPATIENT_CLINIC_OR_DEPARTMENT_OTHER): Payer: Self-pay

## 2022-09-07 MED ORDER — LEVOTHYROXINE SODIUM 75 MCG PO TABS
75.0000 ug | ORAL_TABLET | Freq: Every day | ORAL | 3 refills | Status: AC
  Filled 2022-09-07: qty 90, 90d supply, fill #0

## 2022-09-07 MED ORDER — METFORMIN HCL ER 500 MG PO TB24
1000.0000 mg | ORAL_TABLET | Freq: Every day | ORAL | 11 refills | Status: AC
  Filled 2022-09-07: qty 60, 30d supply, fill #0

## 2022-09-11 ENCOUNTER — Other Ambulatory Visit (HOSPITAL_BASED_OUTPATIENT_CLINIC_OR_DEPARTMENT_OTHER): Payer: Self-pay

## 2022-09-11 MED ORDER — PREDNISONE 50 MG PO TABS
50.0000 mg | ORAL_TABLET | Freq: Every day | ORAL | 0 refills | Status: AC
  Filled 2022-09-11: qty 5, 5d supply, fill #0

## 2022-09-15 ENCOUNTER — Other Ambulatory Visit (HOSPITAL_BASED_OUTPATIENT_CLINIC_OR_DEPARTMENT_OTHER): Payer: Self-pay

## 2022-09-15 MED ORDER — MOUNJARO 7.5 MG/0.5ML ~~LOC~~ SOAJ
7.5000 mg | SUBCUTANEOUS | 3 refills | Status: AC
  Filled 2022-09-15: qty 2, 28d supply, fill #0

## 2022-10-06 ENCOUNTER — Other Ambulatory Visit (HOSPITAL_BASED_OUTPATIENT_CLINIC_OR_DEPARTMENT_OTHER): Payer: Self-pay

## 2022-10-15 ENCOUNTER — Other Ambulatory Visit (HOSPITAL_BASED_OUTPATIENT_CLINIC_OR_DEPARTMENT_OTHER): Payer: Self-pay

## 2022-10-15 MED ORDER — MOUNJARO 7.5 MG/0.5ML ~~LOC~~ SOAJ
SUBCUTANEOUS | 3 refills | Status: AC
  Filled 2022-10-15: qty 2, 28d supply, fill #0

## 2022-10-27 ENCOUNTER — Other Ambulatory Visit (HOSPITAL_BASED_OUTPATIENT_CLINIC_OR_DEPARTMENT_OTHER): Payer: Self-pay

## 2022-10-27 MED ORDER — METFORMIN HCL ER 500 MG PO TB24
1000.0000 mg | ORAL_TABLET | Freq: Every day | ORAL | 11 refills | Status: DC
  Filled 2022-10-27: qty 60, 30d supply, fill #0

## 2022-10-27 MED ORDER — MOUNJARO 7.5 MG/0.5ML ~~LOC~~ SOAJ
7.5000 mg | SUBCUTANEOUS | 3 refills | Status: AC
  Filled 2022-11-02 – 2022-11-06 (×2): qty 2, 28d supply, fill #0

## 2022-10-28 ENCOUNTER — Other Ambulatory Visit: Payer: Self-pay

## 2022-11-02 ENCOUNTER — Other Ambulatory Visit (HOSPITAL_BASED_OUTPATIENT_CLINIC_OR_DEPARTMENT_OTHER): Payer: Self-pay

## 2022-11-06 ENCOUNTER — Other Ambulatory Visit: Payer: Self-pay

## 2022-11-06 ENCOUNTER — Other Ambulatory Visit (HOSPITAL_BASED_OUTPATIENT_CLINIC_OR_DEPARTMENT_OTHER): Payer: Self-pay

## 2022-12-11 ENCOUNTER — Other Ambulatory Visit (HOSPITAL_BASED_OUTPATIENT_CLINIC_OR_DEPARTMENT_OTHER): Payer: Self-pay

## 2022-12-11 MED ORDER — LEVOTHYROXINE SODIUM 75 MCG PO TABS
75.0000 ug | ORAL_TABLET | Freq: Every day | ORAL | 3 refills | Status: AC
  Filled 2022-12-11: qty 90, 90d supply, fill #0

## 2022-12-11 MED ORDER — MOUNJARO 7.5 MG/0.5ML ~~LOC~~ SOAJ
7.5000 mg | SUBCUTANEOUS | 3 refills | Status: DC
  Filled 2022-12-11: qty 2, 28d supply, fill #0

## 2022-12-29 ENCOUNTER — Other Ambulatory Visit: Payer: Self-pay

## 2022-12-29 ENCOUNTER — Other Ambulatory Visit (HOSPITAL_BASED_OUTPATIENT_CLINIC_OR_DEPARTMENT_OTHER): Payer: Self-pay

## 2022-12-29 MED ORDER — MOUNJARO 10 MG/0.5ML ~~LOC~~ SOAJ
10.0000 mg | SUBCUTANEOUS | 0 refills | Status: DC
  Filled 2022-12-29: qty 2, 28d supply, fill #0

## 2022-12-29 MED ORDER — ALLOPURINOL 100 MG PO TABS
100.0000 mg | ORAL_TABLET | Freq: Every day | ORAL | 3 refills | Status: AC
  Filled 2022-12-29: qty 90, 90d supply, fill #0

## 2022-12-29 MED ORDER — COLCHICINE 0.6 MG PO TABS
ORAL_TABLET | ORAL | 3 refills | Status: AC
  Filled 2022-12-29: qty 12, 5d supply, fill #0

## 2022-12-29 MED ORDER — MOUNJARO 15 MG/0.5ML ~~LOC~~ SOAJ: 15.0000 mg | SUBCUTANEOUS | 11 refills | Status: AC

## 2022-12-29 MED ORDER — MOUNJARO 12.5 MG/0.5ML ~~LOC~~ SOAJ: 12.5000 mg | SUBCUTANEOUS | 0 refills | Status: AC

## 2023-01-03 ENCOUNTER — Other Ambulatory Visit (HOSPITAL_BASED_OUTPATIENT_CLINIC_OR_DEPARTMENT_OTHER): Payer: Self-pay

## 2023-02-03 ENCOUNTER — Other Ambulatory Visit (HOSPITAL_BASED_OUTPATIENT_CLINIC_OR_DEPARTMENT_OTHER): Payer: Self-pay

## 2023-02-03 MED ORDER — MOUNJARO 12.5 MG/0.5ML ~~LOC~~ SOAJ
12.5000 mg | SUBCUTANEOUS | 0 refills | Status: DC
  Filled 2023-02-03: qty 2, 28d supply, fill #0

## 2023-02-16 ENCOUNTER — Other Ambulatory Visit (HOSPITAL_BASED_OUTPATIENT_CLINIC_OR_DEPARTMENT_OTHER): Payer: Self-pay

## 2023-02-16 MED ORDER — ESCITALOPRAM OXALATE 10 MG PO TABS
10.0000 mg | ORAL_TABLET | Freq: Every morning | ORAL | 3 refills | Status: AC
  Filled 2023-02-16: qty 90, 90d supply, fill #0

## 2023-02-20 ENCOUNTER — Other Ambulatory Visit (HOSPITAL_BASED_OUTPATIENT_CLINIC_OR_DEPARTMENT_OTHER): Payer: Self-pay

## 2023-03-08 ENCOUNTER — Other Ambulatory Visit: Payer: Self-pay

## 2023-03-08 ENCOUNTER — Other Ambulatory Visit (HOSPITAL_BASED_OUTPATIENT_CLINIC_OR_DEPARTMENT_OTHER): Payer: Self-pay

## 2023-03-08 MED ORDER — MOUNJARO 15 MG/0.5ML ~~LOC~~ SOAJ
15.0000 mg | SUBCUTANEOUS | 11 refills | Status: AC
  Filled 2023-03-08: qty 2, 28d supply, fill #0

## 2023-03-19 ENCOUNTER — Other Ambulatory Visit (HOSPITAL_BASED_OUTPATIENT_CLINIC_OR_DEPARTMENT_OTHER): Payer: Self-pay

## 2023-03-19 MED ORDER — NA SULFATE-K SULFATE-MG SULF 17.5-3.13-1.6 GM/177ML PO SOLN
ORAL | 0 refills | Status: DC
  Filled 2023-03-19: qty 354, 1d supply, fill #0

## 2023-03-23 ENCOUNTER — Other Ambulatory Visit (HOSPITAL_BASED_OUTPATIENT_CLINIC_OR_DEPARTMENT_OTHER): Payer: Self-pay

## 2023-03-31 ENCOUNTER — Other Ambulatory Visit (HOSPITAL_BASED_OUTPATIENT_CLINIC_OR_DEPARTMENT_OTHER): Payer: Self-pay

## 2023-03-31 MED ORDER — LEVOTHYROXINE SODIUM 75 MCG PO TABS
75.0000 ug | ORAL_TABLET | Freq: Every day | ORAL | 3 refills | Status: AC
  Filled 2023-03-31 – 2023-04-26 (×3): qty 90, 90d supply, fill #0

## 2023-04-12 ENCOUNTER — Other Ambulatory Visit (HOSPITAL_BASED_OUTPATIENT_CLINIC_OR_DEPARTMENT_OTHER): Payer: Self-pay

## 2023-04-14 ENCOUNTER — Other Ambulatory Visit (HOSPITAL_BASED_OUTPATIENT_CLINIC_OR_DEPARTMENT_OTHER): Payer: Self-pay

## 2023-04-14 MED ORDER — MOUNJARO 15 MG/0.5ML ~~LOC~~ SOAJ
15.0000 mg | SUBCUTANEOUS | 11 refills | Status: AC
  Filled 2023-04-14 – 2023-04-26 (×2): qty 2, 28d supply, fill #0

## 2023-04-23 ENCOUNTER — Other Ambulatory Visit (HOSPITAL_BASED_OUTPATIENT_CLINIC_OR_DEPARTMENT_OTHER): Payer: Self-pay

## 2023-04-24 ENCOUNTER — Other Ambulatory Visit (HOSPITAL_BASED_OUTPATIENT_CLINIC_OR_DEPARTMENT_OTHER): Payer: Self-pay

## 2023-04-26 ENCOUNTER — Other Ambulatory Visit: Payer: Self-pay

## 2023-04-26 ENCOUNTER — Other Ambulatory Visit (HOSPITAL_BASED_OUTPATIENT_CLINIC_OR_DEPARTMENT_OTHER): Payer: Self-pay

## 2023-04-26 MED ORDER — ALLOPURINOL 100 MG PO TABS
100.0000 mg | ORAL_TABLET | Freq: Every day | ORAL | 3 refills | Status: AC
  Filled 2023-04-26: qty 90, 90d supply, fill #0

## 2023-05-24 ENCOUNTER — Other Ambulatory Visit (HOSPITAL_BASED_OUTPATIENT_CLINIC_OR_DEPARTMENT_OTHER): Payer: Self-pay

## 2023-05-24 MED ORDER — MOUNJARO 15 MG/0.5ML ~~LOC~~ SOAJ
15.0000 mg | SUBCUTANEOUS | 11 refills | Status: AC
  Filled 2023-05-24: qty 2, 28d supply, fill #0

## 2023-05-24 MED ORDER — ESCITALOPRAM OXALATE 10 MG PO TABS
10.0000 mg | ORAL_TABLET | Freq: Every day | ORAL | 3 refills | Status: AC
  Filled 2023-05-24: qty 90, 90d supply, fill #0

## 2023-06-21 ENCOUNTER — Other Ambulatory Visit (HOSPITAL_BASED_OUTPATIENT_CLINIC_OR_DEPARTMENT_OTHER): Payer: Self-pay

## 2023-06-21 MED ORDER — MOUNJARO 15 MG/0.5ML ~~LOC~~ SOAJ
15.0000 mg | SUBCUTANEOUS | 11 refills | Status: AC
  Filled 2023-06-21: qty 2, 28d supply, fill #0
  Filled 2023-10-09 (×2): qty 2, 28d supply, fill #1

## 2023-06-21 MED ORDER — ALLOPURINOL 100 MG PO TABS
200.0000 mg | ORAL_TABLET | Freq: Every day | ORAL | 3 refills | Status: AC
  Filled 2023-06-21 – 2023-06-23 (×2): qty 180, 90d supply, fill #0

## 2023-06-22 ENCOUNTER — Other Ambulatory Visit (HOSPITAL_BASED_OUTPATIENT_CLINIC_OR_DEPARTMENT_OTHER): Payer: Self-pay

## 2023-06-23 ENCOUNTER — Other Ambulatory Visit (HOSPITAL_BASED_OUTPATIENT_CLINIC_OR_DEPARTMENT_OTHER): Payer: Self-pay

## 2023-06-25 ENCOUNTER — Other Ambulatory Visit: Payer: Self-pay

## 2023-06-26 ENCOUNTER — Other Ambulatory Visit (HOSPITAL_BASED_OUTPATIENT_CLINIC_OR_DEPARTMENT_OTHER): Payer: Self-pay

## 2023-06-29 ENCOUNTER — Encounter: Payer: Self-pay | Admitting: *Deleted

## 2023-06-30 ENCOUNTER — Other Ambulatory Visit: Payer: Self-pay

## 2023-07-01 ENCOUNTER — Other Ambulatory Visit (HOSPITAL_BASED_OUTPATIENT_CLINIC_OR_DEPARTMENT_OTHER): Payer: Self-pay

## 2023-07-08 ENCOUNTER — Encounter: Payer: Self-pay | Admitting: *Deleted

## 2023-07-09 ENCOUNTER — Ambulatory Visit: Payer: No Typology Code available for payment source | Admitting: General Practice

## 2023-07-09 ENCOUNTER — Encounter: Payer: Self-pay | Admitting: *Deleted

## 2023-07-09 ENCOUNTER — Encounter
Admission: RE | Disposition: A | Discharge status: Home / Self Care | Payer: Self-pay | Source: Home / Self Care | Attending: Gastroenterology

## 2023-07-09 ENCOUNTER — Ambulatory Visit
Admission: RE | Admit: 2023-07-09 | Discharge: 2023-07-09 | Disposition: A | Discharge status: Home / Self Care | Payer: No Typology Code available for payment source | Attending: Gastroenterology | Admitting: Gastroenterology

## 2023-07-09 DIAGNOSIS — Z1211 Encounter for screening for malignant neoplasm of colon: Secondary | ICD-10-CM | POA: Insufficient documentation

## 2023-07-09 DIAGNOSIS — E119 Type 2 diabetes mellitus without complications: Secondary | ICD-10-CM | POA: Diagnosis not present

## 2023-07-09 DIAGNOSIS — K640 First degree hemorrhoids: Secondary | ICD-10-CM | POA: Diagnosis not present

## 2023-07-09 DIAGNOSIS — Z7985 Long-term (current) use of injectable non-insulin antidiabetic drugs: Secondary | ICD-10-CM | POA: Diagnosis not present

## 2023-07-09 DIAGNOSIS — G4730 Sleep apnea, unspecified: Secondary | ICD-10-CM | POA: Diagnosis not present

## 2023-07-09 DIAGNOSIS — K635 Polyp of colon: Secondary | ICD-10-CM | POA: Insufficient documentation

## 2023-07-09 DIAGNOSIS — J45909 Unspecified asthma, uncomplicated: Secondary | ICD-10-CM | POA: Diagnosis not present

## 2023-07-09 DIAGNOSIS — E0590 Thyrotoxicosis, unspecified without thyrotoxic crisis or storm: Secondary | ICD-10-CM | POA: Diagnosis not present

## 2023-07-09 DIAGNOSIS — Z83719 Family history of colon polyps, unspecified: Secondary | ICD-10-CM | POA: Diagnosis not present

## 2023-07-09 DIAGNOSIS — Z7984 Long term (current) use of oral hypoglycemic drugs: Secondary | ICD-10-CM | POA: Insufficient documentation

## 2023-07-09 DIAGNOSIS — Z800 Family history of malignant neoplasm of digestive organs: Secondary | ICD-10-CM | POA: Diagnosis not present

## 2023-07-09 HISTORY — PX: POLYPECTOMY: SHX5525

## 2023-07-09 HISTORY — DX: Other fracture of unspecified lower leg, initial encounter for closed fracture: S82.899A

## 2023-07-09 HISTORY — PX: COLONOSCOPY WITH PROPOFOL: SHX5780

## 2023-07-09 SURGERY — COLONOSCOPY WITH PROPOFOL
Anesthesia: General

## 2023-07-09 MED ORDER — LIDOCAINE HCL (PF) 2 % IJ SOLN
INTRAMUSCULAR | Status: AC
Start: 2023-07-09 — End: ?
  Filled 2023-07-09: qty 5

## 2023-07-09 MED ORDER — DEXMEDETOMIDINE HCL IN NACL 80 MCG/20ML IV SOLN
INTRAVENOUS | Status: AC
  Filled 2023-07-09: qty 20

## 2023-07-09 MED ORDER — LIDOCAINE HCL (CARDIAC) PF 100 MG/5ML IV SOSY
PREFILLED_SYRINGE | INTRAVENOUS | Status: DC | PRN
  Administered 2023-07-09: 80 mg via INTRAVENOUS

## 2023-07-09 MED ORDER — PROPOFOL 500 MG/50ML IV EMUL
INTRAVENOUS | Status: DC | PRN
  Administered 2023-07-09: 150 ug/kg/min via INTRAVENOUS
  Administered 2023-07-09: 50 mg via INTRAVENOUS
  Administered 2023-07-09: 75 mg via INTRAVENOUS

## 2023-07-09 MED ORDER — PROPOFOL 10 MG/ML IV BOLUS
INTRAVENOUS | Status: AC
  Filled 2023-07-09: qty 40

## 2023-07-09 MED ORDER — SODIUM CHLORIDE 0.9 % IV SOLN: INTRAVENOUS | Status: DC

## 2023-07-09 MED ORDER — DEXMEDETOMIDINE HCL IN NACL 80 MCG/20ML IV SOLN
INTRAVENOUS | Status: DC | PRN
Start: 2023-07-09 — End: 2023-07-09
  Administered 2023-07-09: 4 ug via INTRAVENOUS
  Administered 2023-07-09: 8 ug via INTRAVENOUS

## 2023-07-09 NOTE — Op Note (Signed)
[facility] Patient Name: Jeffery Soto Procedure Date: 07/09/2023 7:14 AM MRN: [id number] Account #: [id number] Date of Birth: 1985/11/18 Admit Type: Outpatient Age: 38 Room: [facility] ENDO ROOM 3 Gender: Male Note Status: Finalized Instrument Name: Prentice Docker [id number] Procedure:             Colonoscopy Indications:           Colon cancer screening in patient at increased risk:                         Family history of 1st-degree relative with colon                         polyps before age 11 years Providers:             Eather Colas MD, MD Referring MD:          Clydie Braun (Referring MD) Medicines:             Monitored Anesthesia Care Complications:         No immediate complications. Estimated blood loss:                         Minimal. Procedure:             Pre-Anesthesia Assessment:                        - Prior to the procedure, a History and Physical was                         performed, and patient medications and allergies were                         reviewed. The patient is competent. The risks and                         benefits of the procedure and the sedation options and                         risks were discussed with the patient. All questions                         were answered and informed consent was obtained.                         Patient identification and proposed procedure were                         verified by the physician, the nurse, the                         anesthesiologist, the anesthetist and the technician                         in the endoscopy suite. Mental Status Examination:                         alert and oriented. Airway Examination: normal  oropharyngeal airway and neck mobility. Respiratory                         Examination: clear to auscultation. CV Examination:                         normal. Prophylactic Antibiotics: The patient does not                          require prophylactic antibiotics. Prior                         Anticoagulants: The patient has taken no anticoagulant                         or antiplatelet agents. ASA Grade Assessment: III - A                         patient with severe systemic disease. After reviewing                         the risks and benefits, the patient was deemed in                         satisfactory condition to undergo the procedure. The                         anesthesia plan was to use monitored anesthesia care                         (MAC). Immediately prior to administration of                         medications, the patient was re-assessed for adequacy                         to receive sedatives. The heart rate, respiratory                         rate, oxygen saturations, blood pressure, adequacy of                         pulmonary ventilation, and response to care were                         monitored throughout the procedure. The physical                         status of the patient was re-assessed after the                         procedure.                        After obtaining informed consent, the colonoscope was                         passed under direct vision. Throughout the procedure,  the patient's blood pressure, pulse, and oxygen                         saturations were monitored continuously. The                         Colonoscope was introduced through the anus and                         advanced to the the cecum, identified by appendiceal                         orifice and ileocecal valve. The colonoscopy was                         performed without difficulty. The patient tolerated                         the procedure well. The quality of the bowel                         preparation was inadequate. The ileocecal valve,                         appendiceal orifice, and rectum were photographed. Findings:      The perianal and digital rectal  examinations were normal.      A 2 mm polyp was found in the sigmoid colon. The polyp was sessile. The       polyp was removed with a cold snare. Resection and retrieval were       complete. Estimated blood loss was minimal.      Internal hemorrhoids were found during retroflexion. The hemorrhoids       were Grade I (internal hemorrhoids that do not prolapse).      The exam was otherwise without abnormality on direct and retroflexion       views. Impression:            - Preparation of the colon was inadequate.                        - One 2 mm polyp in the sigmoid colon, removed with a                         cold snare. Resected and retrieved.                        - Internal hemorrhoids.                        - The examination was otherwise normal on direct and                         retroflexion views. Recommendation:        - Discharge patient to home.                        - Resume previous diet.                        - Continue present medications.                        -  Await pathology results.                        - Repeat colonoscopy in 1 year because the bowel                         preparation was suboptimal.                        - Return to referring physician as previously                         scheduled.                        - If mother did indeed have a significant number of                         polyps as stated (hundreds per patient) then would                         recommend genetic testing. Procedure Code(s):     --- Professional ---                        [phone], Colonoscopy, flexible; with removal of                         tumor(s), polyp(s), or other lesion(s) by snare                         technique Diagnosis Code(s):     --- Professional ---                        Z83.71, Family history of colonic polyps                        K64.0, First degree hemorrhoids                        D12.5, Benign neoplasm of sigmoid colon CPT copyright  2022 [employer]. All rights reserved. The codes documented in this report are preliminary and upon coder review may  be revised to meet current compliance requirements. Eather Colas MD, MD 07/09/2023 8:22:54 AM Number of Addenda: 0 Note Initiated On: 07/09/2023 7:14 AM Scope Withdrawal Time: 0 hours 4 minutes 25 seconds  Total Procedure Duration: 0 hours 8 minutes 15 seconds  Estimated Blood Loss:  Estimated blood loss was minimal.      [facility]

## 2023-07-09 NOTE — Anesthesia Postprocedure Evaluation (Signed)
Anesthesia Post Note  Patient: DYAMI UMBACH  Procedure(s) Performed: COLONOSCOPY WITH PROPOFOL POLYPECTOMY  Patient location during evaluation: Endoscopy Anesthesia Type: General Level of consciousness: awake and alert Pain management: pain level controlled Vital Signs Assessment: post-procedure vital signs reviewed and stable Respiratory status: spontaneous breathing, nonlabored ventilation, respiratory function stable and patient connected to nasal cannula oxygen Cardiovascular status: blood pressure returned to baseline and stable Postop Assessment: no apparent nausea or vomiting Anesthetic complications: no  No notable events documented.   Last Vitals:  Vitals:   07/09/23 0829 07/09/23 0839  BP: 127/82 128/62  Pulse: 90 86  Resp: 15 18  Temp:    SpO2: 97% 99%    Last Pain:  Vitals:   07/09/23 0839  TempSrc:   PainSc: 0-No pain                 Stephanie Coup

## 2023-07-09 NOTE — H&P (Signed)
Outpatient short stay form Pre-procedure 07/09/2023  Regis Bill, MD  Primary Physician: Mick Sell, MD  Reason for visit:  Screening  History of present illness:    38 y/o lady with history of DM II, hypothyroidism, and obesity here for screening colonoscopy. Had a colonoscopy 15 years ago that was normal. States his Mother has had 100's of colon polyps. No blood thinners. No abdominal surgeries. Several 2nd/3rd degree relatives with colon cancer.    Current Facility-Administered Medications:    0.9 %  sodium chloride infusion, , Intravenous, Continuous, Arely Tinner, Rossie Muskrat, MD, Last Rate: 20 mL/hr at 07/09/23 0728, New Bag at 07/09/23 0728  Medications Prior to Admission  Medication Sig Dispense Refill Last Dose/Taking   albuterol (PROVENTIL HFA;VENTOLIN HFA) 108 (90 Base) MCG/ACT inhaler Inhale into the lungs. (Patient not taking: Reported on 01/13/2021)      albuterol (VENTOLIN HFA) 108 (90 Base) MCG/ACT inhaler Inhale 2 puffs into the lungs every 6 (six) hours as needed for wheezing. 6.7 g 0    allopurinol (ZYLOPRIM) 100 MG tablet Take 1 tablet (100 mg total) by mouth daily. 90 tablet 3    allopurinol (ZYLOPRIM) 100 MG tablet Take 1 tablet (100 mg total) by mouth daily. 90 tablet 3    allopurinol (ZYLOPRIM) 100 MG tablet Take 2 tablets (200 mg total) by mouth daily. 180 tablet 3    azithromycin (ZITHROMAX) 250 MG tablet Take 2 tablets by mouth on day 1, then 1 tablet by mouth daily for 4 days 6 tablet 0    colchicine 0.6 MG tablet Take 2 tablets (1.2 mg) at onset of gout flare. Take 1 additional tablet (0.6 mg) one hour later if symptoms persist. Maximum dose is 1.8 mg (3 tablets) in 24 hours. Then take one tablet (0.6 mg) daily until symptoms resolve. 12 tablet 3    doxycycline (ADOXA) 100 MG tablet Take 1 tablet (100 mg total) by mouth in the morning and 1 tablet (100 mg total) in the evening. Take with a full glass of water and do not lie down for at least 30 minutes  after. 14 tablet 0    escitalopram (LEXAPRO) 10 MG tablet Take 20 mg by mouth every morning.      escitalopram (LEXAPRO) 10 MG tablet Take 1 tablet (10 mg total) by mouth every morning. 90 tablet 3    escitalopram (LEXAPRO) 10 MG tablet Take 1 tablet (10 mg total) by mouth every morning 90 tablet 3    escitalopram (LEXAPRO) 10 MG tablet Take 1 tablet (10 mg total) by mouth in the morning. 90 tablet 3    escitalopram (LEXAPRO) 10 MG tablet Take 1 tablet (10 mg total) by mouth daily every morning 90 tablet 3    Fexofenadine HCl (ALLEGRA PO) Take by mouth as needed.      GLYXAMBI 25-5 MG TABS Take 1 tablet by mouth once daily 90 tablet 3    levothyroxine (SYNTHROID) 75 MCG tablet Take 1 tablet (75 mcg total) by mouth once daily. Take on an empty stomach with a glass of water at least 30-60 minutes before breakfast. 90 tablet 3    levothyroxine (SYNTHROID) 75 MCG tablet Take 1 tablet (75 mcg total) by mouth daily. Take on an empty stomach with a glass of water at least 30-60 minutes before breakfast. 90 tablet 3    levothyroxine (SYNTHROID) 75 MCG tablet Take 1 tablet (75 mcg total) by mouth once daily, Take on an empty stomach with a glass of water  at least 30-60 minutes before breakfast. 90 tablet 3    levothyroxine (SYNTHROID) 75 MCG tablet Take 1 tablet (75 mcg total) by mouth daily.Take on an empty stomach with a glass of water at least 30-60 minutes before breakfast. 90 tablet 3    levothyroxine (SYNTHROID, LEVOTHROID) 75 MCG tablet Take 1 tablet (75 mcg total) by mouth daily. Follow up for repeat testing in 2 months 90 tablet 0    metFORMIN (GLUCOPHAGE-XR) 500 MG 24 hr tablet Take 2 tablets (1,000 mg total) by mouth daily with supper. 60 tablet 11    metFORMIN (GLUCOPHAGE-XR) 500 MG 24 hr tablet Take 2 tablets (1,000 mg total) by mouth daily with dinner 60 tablet 11    metFORMIN (GLUCOPHAGE-XR) 500 MG 24 hr tablet Take 2 tablets (1,000 mg total) by mouth daily with supper. 60 tablet 11    Na  Sulfate-K Sulfate-Mg Sulf 17.5-3.13-1.6 GM/177ML SOLN Take both bottles at the times instructed by your provider. 354 mL 0    predniSONE (DELTASONE) 10 MG tablet Take as directed on attached sheet. 21 tablet 0    predniSONE (DELTASONE) 20 MG tablet Take 3 tablets (60 mg total) by mouth once daily for 2 days, THEN 2 tablets (40 mg total) once daily for 2 days, THEN 1 tablet (20 mg total) once daily for 2 days. 12 tablet 0    predniSONE (DELTASONE) 50 MG tablet Take 1 tablet (50 mg total) by mouth daily for 5 days. 5 tablet 0    tirzepatide (MOUNJARO) 12.5 MG/0.5ML Pen Inject 12.5 mg into the skin every 7 (seven) days. 2 mL 0    tirzepatide (MOUNJARO) 15 MG/0.5ML Pen Inject 15 mg into the skin every 7 (seven) days. 2 mL 11 06/25/2023   tirzepatide (MOUNJARO) 15 MG/0.5ML Pen Inject 15 mg into the skin once a week. 2 mL 11    tirzepatide (MOUNJARO) 15 MG/0.5ML Pen Inject 15 mg into the skin every 7 (seven) days. 2 mL 11    tirzepatide (MOUNJARO) 15 MG/0.5ML Pen Inject 0.5 mLs (15 mg total) subcutaneously every 7 (seven) days 2 mL 11    tirzepatide (MOUNJARO) 15 MG/0.5ML Pen Inject 15 mg into the skin once a week. 2 mL 11    tirzepatide (MOUNJARO) 2.5 MG/0.5ML Pen Inject 2.5 mg into the skin once a week. 2 mL 0    tirzepatide (MOUNJARO) 5 MG/0.5ML Pen Inject 0.5 mLs (5 mg total) subcutaneously every 7 (seven) days 2 mL 0    tirzepatide (MOUNJARO) 7.5 MG/0.5ML Pen Inject 0.5 mLs (7.5 mg total) subcutaneously every 7 (seven) days 2 mL 3    tirzepatide (MOUNJARO) 7.5 MG/0.5ML Pen Inject 7.5 mg into the skin once a week. 2 mL 3    tirzepatide (MOUNJARO) 7.5 MG/0.5ML Pen Inject 0.5 mLs (7.5 mg total) under the skin every 7 (seven) days. 2 mL 3    tirzepatide (MOUNJARO) 7.5 MG/0.5ML Pen Inject 7.5 mg into the skin every 7 (seven) days. 2 mL 3      Allergies  Allergen Reactions   Amoxicillin Swelling   Penicillins      Past Medical History:  Diagnosis Date   Asthma    Fracture, ankle    High  cholesterol    Hyperthyroidism     Review of systems:  Otherwise negative.    Physical Exam  Gen: Alert, oriented. Appears stated age.  HEENT: PERRLA. Lungs: No respiratory distress CV: RRR Abd: soft, benign, no masses Ext: No edema    Planned procedures: Proceed with  colonoscopy. The patient understands the nature of the planned procedure, indications, risks, alternatives and potential complications including but not limited to bleeding, infection, perforation, damage to internal organs and possible oversedation/side effects from anesthesia. The patient agrees and gives consent to proceed.  Please refer to procedure notes for findings, recommendations and patient disposition/instructions.     Regis Bill, MD [facility]

## 2023-07-09 NOTE — Transfer of Care (Signed)
Immediate Anesthesia Transfer of Care Note  Patient: Jeffery Soto  Procedure(s) Performed: COLONOSCOPY WITH PROPOFOL  Patient Location: PACU and Endoscopy Unit  Anesthesia Type:General  Level of Consciousness: awake  Airway & Oxygen Therapy: Patient Spontanous Breathing  Post-op Assessment: Report given to RN and Post -op Vital signs reviewed and stable  Post vital signs: Reviewed and stable  Last Vitals:  Vitals Value Taken Time  BP 113/76 07/09/23 0819  Temp    Pulse 97 07/09/23 0822  Resp 17 07/09/23 0822  SpO2 94 % 07/09/23 0822  Vitals shown include unfiled device data.  Last Pain:  Vitals:   07/09/23 0819  TempSrc:   PainSc: 0-No pain         Complications: No notable events documented.

## 2023-07-09 NOTE — Interval H&P Note (Signed)
History and Physical Interval Note:  07/09/2023 7:53 AM  Jeffery Soto  has presented today for surgery, with the diagnosis of Z12.11 (ICD-10-CM) - Colon cancer screening Z83.719 (ICD-10-CM) - Family hx colonic polyps.  The various methods of treatment have been discussed with the patient and family. After consideration of risks, benefits and other options for treatment, the patient has consented to  Procedure(s): COLONOSCOPY WITH PROPOFOL (N/A) as a surgical intervention.  The patient's history has been reviewed, patient examined, no change in status, stable for surgery.  I have reviewed the patient's chart and labs.  Questions were answered to the patient's satisfaction.     Regis Bill  Ok to proceed with colonoscopy

## 2023-07-09 NOTE — Anesthesia Preprocedure Evaluation (Addendum)
Anesthesia Evaluation  Patient identified by MRN, date of birth, ID band Patient awake    Reviewed: Allergy & Precautions, NPO status , Patient's Chart, lab work & pertinent test results  Airway Mallampati: III  TM Distance: >3 FB Neck ROM: full    Dental  (+) Chipped, Dental Advidsory Given   Pulmonary asthma , sleep apnea and Continuous Positive Airway Pressure Ventilation , former smoker   Pulmonary exam normal        Cardiovascular negative cardio ROS Normal cardiovascular exam     Neuro/Psych negative neurological ROS  negative psych ROS   GI/Hepatic negative GI ROS, Neg liver ROS,,,  Endo/Other  diabetes, Type 2 Hyperthyroidism Class 3 obesity  Renal/GU negative Renal ROS  negative genitourinary   Musculoskeletal   Abdominal   Peds  Hematology negative hematology ROS (+)   Anesthesia Other Findings Past Medical History: No date: Asthma No date: Fracture, ankle No date: High cholesterol No date: Hyperthyroidism  Past Surgical History: 2014: MOUTH SURGERY No date: None  BMI    Body Mass Index: 41.12 kg/m      Reproductive/Obstetrics negative OB ROS                             Anesthesia Physical Anesthesia Plan  ASA: 3  Anesthesia Plan: General   Post-op Pain Management: Minimal or no pain anticipated   Induction: Intravenous  PONV Risk Score and Plan: 3 and Propofol infusion, TIVA and Ondansetron  Airway Management Planned: Nasal Cannula  Additional Equipment: None  Intra-op Plan:   Post-operative Plan:   Informed Consent: I have reviewed the patients History and Physical, chart, labs and discussed the procedure including the risks, benefits and alternatives for the proposed anesthesia with the patient or authorized representative who has indicated his/her understanding and acceptance.     Dental advisory given  Plan Discussed with: CRNA and  Surgeon  Anesthesia Plan Comments: (Discussed risks of anesthesia with patient, including possibility of difficulty with spontaneous ventilation under anesthesia necessitating airway intervention, PONV, and rare risks such as cardiac or respiratory or neurological events, and allergic reactions. Discussed the role of CRNA in patient's perioperative care. Patient understands.)       Anesthesia Quick Evaluation

## 2023-07-10 NOTE — Progress Notes (Signed)
Non-identified Voicemail.  No Message Left. 

## 2023-07-12 ENCOUNTER — Encounter: Payer: Self-pay | Admitting: Gastroenterology

## 2023-07-12 LAB — SURGICAL PATHOLOGY

## 2023-07-15 ENCOUNTER — Other Ambulatory Visit: Payer: Self-pay

## 2023-07-19 ENCOUNTER — Other Ambulatory Visit (HOSPITAL_BASED_OUTPATIENT_CLINIC_OR_DEPARTMENT_OTHER): Payer: Self-pay

## 2023-08-02 ENCOUNTER — Other Ambulatory Visit: Payer: Self-pay

## 2023-08-02 ENCOUNTER — Other Ambulatory Visit (HOSPITAL_BASED_OUTPATIENT_CLINIC_OR_DEPARTMENT_OTHER): Payer: Self-pay

## 2023-08-02 MED ORDER — LEVOTHYROXINE SODIUM 75 MCG PO TABS
75.0000 ug | ORAL_TABLET | Freq: Every day | ORAL | 3 refills | Status: AC
  Filled 2023-08-02 – 2023-08-14 (×2): qty 90, 90d supply, fill #0

## 2023-08-02 MED ORDER — MOUNJARO 15 MG/0.5ML ~~LOC~~ SOAJ
15.0000 mg | SUBCUTANEOUS | 11 refills | Status: AC
Start: 2023-08-02 — End: ?
  Filled 2023-08-02: qty 2, 28d supply, fill #0

## 2023-08-12 ENCOUNTER — Other Ambulatory Visit (HOSPITAL_BASED_OUTPATIENT_CLINIC_OR_DEPARTMENT_OTHER): Payer: Self-pay

## 2023-08-12 MED ORDER — MOUNJARO 15 MG/0.5ML ~~LOC~~ SOAJ
15.0000 mg | SUBCUTANEOUS | 2 refills | Status: AC
Start: 2023-08-12 — End: ?
  Filled 2023-08-12: qty 2, 28d supply, fill #0

## 2023-08-14 ENCOUNTER — Other Ambulatory Visit (HOSPITAL_BASED_OUTPATIENT_CLINIC_OR_DEPARTMENT_OTHER): Payer: Self-pay

## 2023-08-19 ENCOUNTER — Other Ambulatory Visit (HOSPITAL_BASED_OUTPATIENT_CLINIC_OR_DEPARTMENT_OTHER): Payer: Self-pay

## 2023-08-19 MED ORDER — ESCITALOPRAM OXALATE 10 MG PO TABS
10.0000 mg | ORAL_TABLET | Freq: Every morning | ORAL | 3 refills | Status: AC
  Filled 2023-08-19 – 2023-09-14 (×2): qty 90, 90d supply, fill #0

## 2023-08-23 ENCOUNTER — Telehealth: Payer: Self-pay

## 2023-08-23 NOTE — Telephone Encounter (Signed)
 Please see MyChart message.

## 2023-08-23 NOTE — Progress Notes (Unsigned)
 Jeffery Soto

## 2023-08-24 ENCOUNTER — Telehealth (INDEPENDENT_AMBULATORY_CARE_PROVIDER_SITE_OTHER): Payer: No Typology Code available for payment source | Admitting: Adult Health

## 2023-08-24 DIAGNOSIS — G4733 Obstructive sleep apnea (adult) (pediatric): Secondary | ICD-10-CM

## 2023-08-24 NOTE — Progress Notes (Signed)
 PATIENT: Jeffery Soto DOB: 1985/06/07  REASON FOR VISIT: follow up HISTORY FROM: patient PRIMARY NEUROLOGIST: Dr. Frances Furbish   Virtual Visit via Video Note  I connected with Jeffery Soto on 08/24/23 at  2:00 PM EDT by a video enabled telemedicine application located remotely at [facility] and verified that I am speaking with the correct person using two identifiers who was located [place].    I discussed the limitations of evaluation and management by telemedicine and the availability of in person appointments. The patient expressed understanding and agreed to proceed.   PATIENT: Jeffery Soto DOB: 1985-09-07  REASON FOR VISIT: follow up HISTORY FROM: patient  HISTORY OF PRESENT ILLNESS: Today 08/24/23:  Jeffery Soto is a 38 y.o. male with a history of OSA on CPAP. Returns today for follow-up. Reports that it is working well.  Reports that he can tell a difference if he goes a night without it.  His download is below     08/25/22: Jeffery Soto is a 38 y.o. male with a history of OSA on CPAP. Returns today for follow-up.  States that he loves his machine.  Continues to find it beneficial.  Denies any new issues.        08/08/21:Jeffery Soto is a 38 year old male with a history of obstructive sleep apnea on CPAP.  He returns today for follow-up.  He reports that the CPAP is working well for him.  He states that he does not like to sleep without it.  He does have the nasal mask.  Any nights that he does not use it is usually due to nasal congestion.    REVIEW OF SYSTEMS: Out of a complete 14 system review of symptoms, the patient complains only of the following symptoms, and all other reviewed systems are negative.  ALLERGIES: Allergies  Allergen Reactions   Amoxicillin Swelling   Penicillins     HOME MEDICATIONS: Outpatient Medications Prior to Visit  Medication Sig Dispense Refill   albuterol (PROVENTIL HFA;VENTOLIN HFA) 108 (90 Base)  MCG/ACT inhaler Inhale into the lungs. (Patient not taking: Reported on 01/13/2021)     albuterol (VENTOLIN HFA) 108 (90 Base) MCG/ACT inhaler Inhale 2 puffs into the lungs every 6 (six) hours as needed for wheezing. 6.7 g 0   allopurinol (ZYLOPRIM) 100 MG tablet Take 1 tablet (100 mg total) by mouth daily. 90 tablet 3   allopurinol (ZYLOPRIM) 100 MG tablet Take 1 tablet (100 mg total) by mouth daily. 90 tablet 3   allopurinol (ZYLOPRIM) 100 MG tablet Take 2 tablets (200 mg total) by mouth daily. 180 tablet 3   azithromycin (ZITHROMAX) 250 MG tablet Take 2 tablets by mouth on day 1, then 1 tablet by mouth daily for 4 days 6 tablet 0   colchicine 0.6 MG tablet Take 2 tablets (1.2 mg) at onset of gout flare. Take 1 additional tablet (0.6 mg) one hour later if symptoms persist. Maximum dose is 1.8 mg (3 tablets) in 24 hours. Then take one tablet (0.6 mg) daily until symptoms resolve. 12 tablet 3   doxycycline (ADOXA) 100 MG tablet Take 1 tablet (100 mg total) by mouth in the morning and 1 tablet (100 mg total) in the evening. Take with a full glass of water and do not lie down for at least 30 minutes after. 14 tablet 0   escitalopram (LEXAPRO) 10 MG tablet Take 20 mg by mouth every morning.     escitalopram (LEXAPRO) 10 MG  tablet Take 1 tablet (10 mg total) by mouth every morning. 90 tablet 3   escitalopram (LEXAPRO) 10 MG tablet Take 1 tablet (10 mg total) by mouth every morning 90 tablet 3   escitalopram (LEXAPRO) 10 MG tablet Take 1 tablet (10 mg total) by mouth in the morning. 90 tablet 3   escitalopram (LEXAPRO) 10 MG tablet Take 1 tablet (10 mg total) by mouth daily every morning 90 tablet 3   escitalopram (LEXAPRO) 10 MG tablet Take 1 tablet (10 mg total) by mouth in the morning. 90 tablet 3   Fexofenadine HCl (ALLEGRA PO) Take by mouth as needed.     GLYXAMBI 25-5 MG TABS Take 1 tablet by mouth once daily 90 tablet 3   levothyroxine (SYNTHROID) 75 MCG tablet Take 1 tablet (75 mcg total) by mouth  once daily. Take on an empty stomach with a glass of water at least 30-60 minutes before breakfast. 90 tablet 3   levothyroxine (SYNTHROID) 75 MCG tablet Take 1 tablet (75 mcg total) by mouth daily. Take on an empty stomach with a glass of water at least 30-60 minutes before breakfast. 90 tablet 3   levothyroxine (SYNTHROID) 75 MCG tablet Take 1 tablet (75 mcg total) by mouth once daily, Take on an empty stomach with a glass of water at least 30-60 minutes before breakfast. 90 tablet 3   levothyroxine (SYNTHROID) 75 MCG tablet Take 1 tablet (75 mcg total) by mouth daily.Take on an empty stomach with a glass of water at least 30-60 minutes before breakfast. 90 tablet 3   levothyroxine (SYNTHROID) 75 MCG tablet Take 1 tablet (75 mcg total) by mouth daily. Take on an empty stomach with a glass of water at least 30-60 minutes before breakfast. 90 tablet 3   levothyroxine (SYNTHROID, LEVOTHROID) 75 MCG tablet Take 1 tablet (75 mcg total) by mouth daily. Follow up for repeat testing in 2 months 90 tablet 0   metFORMIN (GLUCOPHAGE-XR) 500 MG 24 hr tablet Take 2 tablets (1,000 mg total) by mouth daily with supper. 60 tablet 11   metFORMIN (GLUCOPHAGE-XR) 500 MG 24 hr tablet Take 2 tablets (1,000 mg total) by mouth daily with dinner 60 tablet 11   metFORMIN (GLUCOPHAGE-XR) 500 MG 24 hr tablet Take 2 tablets (1,000 mg total) by mouth daily with supper. 60 tablet 11   Na Sulfate-K Sulfate-Mg Sulf 17.5-3.13-1.6 GM/177ML SOLN Take both bottles at the times instructed by your provider. 354 mL 0   predniSONE (DELTASONE) 10 MG tablet Take as directed on attached sheet. 21 tablet 0   predniSONE (DELTASONE) 20 MG tablet Take 3 tablets (60 mg total) by mouth once daily for 2 days, THEN 2 tablets (40 mg total) once daily for 2 days, THEN 1 tablet (20 mg total) once daily for 2 days. 12 tablet 0   predniSONE (DELTASONE) 50 MG tablet Take 1 tablet (50 mg total) by mouth daily for 5 days. 5 tablet 0   tirzepatide (MOUNJARO)  12.5 MG/0.5ML Pen Inject 12.5 mg into the skin every 7 (seven) days. 2 mL 0   tirzepatide (MOUNJARO) 15 MG/0.5ML Pen Inject 15 mg into the skin every 7 (seven) days. 2 mL 11   tirzepatide (MOUNJARO) 15 MG/0.5ML Pen Inject 15 mg into the skin once a week. 2 mL 11   tirzepatide (MOUNJARO) 15 MG/0.5ML Pen Inject 15 mg into the skin every 7 (seven) days. 2 mL 11   tirzepatide (MOUNJARO) 15 MG/0.5ML Pen Inject 0.5 mLs (15 mg total) subcutaneously every  7 (seven) days 2 mL 11   tirzepatide (MOUNJARO) 15 MG/0.5ML Pen Inject 15 mg into the skin once a week. 2 mL 11   tirzepatide (MOUNJARO) 15 MG/0.5ML Pen Inject 15 mg into the skin every 7 (seven) days. 2 mL 11   tirzepatide (MOUNJARO) 15 MG/0.5ML Pen Inject 15 mg into the skin once a week. 2 mL 2   tirzepatide (MOUNJARO) 2.5 MG/0.5ML Pen Inject 2.5 mg into the skin once a week. 2 mL 0   tirzepatide (MOUNJARO) 5 MG/0.5ML Pen Inject 0.5 mLs (5 mg total) subcutaneously every 7 (seven) days 2 mL 0   tirzepatide (MOUNJARO) 7.5 MG/0.5ML Pen Inject 0.5 mLs (7.5 mg total) subcutaneously every 7 (seven) days 2 mL 3   tirzepatide (MOUNJARO) 7.5 MG/0.5ML Pen Inject 7.5 mg into the skin once a week. 2 mL 3   tirzepatide (MOUNJARO) 7.5 MG/0.5ML Pen Inject 0.5 mLs (7.5 mg total) under the skin every 7 (seven) days. 2 mL 3   tirzepatide (MOUNJARO) 7.5 MG/0.5ML Pen Inject 7.5 mg into the skin every 7 (seven) days. 2 mL 3   No facility-administered medications prior to visit.    PAST MEDICAL HISTORY: Past Medical History:  Diagnosis Date   Asthma    Fracture, ankle    High cholesterol    Hyperthyroidism     PAST SURGICAL HISTORY: Past Surgical History:  Procedure Laterality Date   COLONOSCOPY WITH PROPOFOL N/A 07/09/2023   Procedure: COLONOSCOPY WITH PROPOFOL;  Surgeon: Regis Bill, MD;  Location: [facility] ENDOSCOPY;  Service: Endoscopy;  Laterality: N/A;   MOUTH SURGERY  2014   None     POLYPECTOMY  07/09/2023   Procedure: POLYPECTOMY;  Surgeon:  Regis Bill, MD;  Location: [facility] ENDOSCOPY;  Service: Endoscopy;;    FAMILY HISTORY: Family History  Problem Relation Age of Onset   Heart attack Paternal Grandfather    Irritable bowel syndrome Mother    Arthritis Mother     SOCIAL HISTORY: Social History   Socioeconomic History   Marital status: Married    Spouse name: Not on file   Number of children: 0   Years of education: Masters   Highest education level: Not on file  Occupational History   Not on file  Tobacco Use   Smoking status: Former   Smokeless tobacco: Never   Tobacco comments:     Two cigars, or cigs per week.    Vaping Use   Vaping status: Some Days   Substances: Nicotine, Flavoring  Substance and Sexual Activity   Alcohol use: Yes    Comment: occasionally   Drug use: Yes    Types: Marijuana    Comment: delta 8 gummies   Sexual activity: Yes    Birth control/protection: Condom  Other Topics Concern   Not on file  Social History Narrative   Not on file   Social Drivers of Health   Financial Resource Strain: Low Risk  (12/29/2022)   Received from [facility]   Overall Financial Resource Strain (CARDIA)    Difficulty of Paying Living Expenses: Not hard at all  Food Insecurity: No Food Insecurity (12/29/2022)   Received from [facility]   Hunger Vital Sign    Worried About Running Out of Food in the Last Year: Never true    Ran Out of Food in the Last Year: Never true  Transportation Needs: No Transportation Needs (12/29/2022)   Received from [facility]   [facility] - Transportation  In the past 12 months, has lack of transportation kept you from medical appointments or from getting medications?: No    Lack of Transportation (Non-Medical): No  Physical Activity: Not on file  Stress: Not on file  Social Connections: Not on file  Intimate Partner Violence: Not At Risk (01/13/2021)   Humiliation, Afraid, Rape, and Kick questionnaire     Fear of Current or Ex-Partner: No    Emotionally Abused: No    Physically Abused: No    Sexually Abused: No      PHYSICAL EXAM Generalized: Well developed, in no acute distress   Neurological examination  Mentation: Alert oriented to time, place, history taking. Follows all commands speech and language fluent Cranial nerve II-XII: Facial symmetry noted  DIAGNOSTIC DATA (LABS, IMAGING, TESTING) - I reviewed patient records, labs, notes, testing and imaging myself where available.  Lab Results  Component Value Date   WBC 7.6 04/24/2015   HGB 14.6 04/24/2015   HCT 42.0 (A) 04/24/2015   MCV 83.2 04/24/2015      Component Value Date/Time   NA 141 04/24/2015 0952   K 4.8 04/24/2015 0952   CL 99 04/24/2015 0952   CO2 23 04/24/2015 0952   GLUCOSE 86 04/24/2015 0952   BUN 12 04/24/2015 0952   CREATININE 0.81 04/24/2015 0952   CALCIUM 9.8 04/24/2015 0952   PROT 7.5 04/24/2015 0952   ALBUMIN 4.6 04/24/2015 0952   AST 41 (H) 04/24/2015 0952   ALT 61 (H) 04/24/2015 0952   ALKPHOS 61 04/24/2015 0952   BILITOT 0.5 04/24/2015 0952   Lab Results  Component Value Date   CHOL 227 (H) 04/24/2015   HDL 49 04/24/2015   LDLCALC 141 (H) 04/24/2015   TRIG 187 (H) 04/24/2015   CHOLHDL 4.6 04/24/2015   Lab Results  Component Value Date   HGBA1C 5.5 04/24/2015   No results found for: "VITAMINB12" Lab Results  Component Value Date   TSH 4.655 (H) 04/24/2015      ASSESSMENT AND PLAN 38 y.o. year old male  has a past medical history of Asthma, Fracture, ankle, High cholesterol, and Hyperthyroidism. here with:  OSA on CPAP  CPAP compliance excellent Residual AHI is good Encouraged patient to continue using CPAP nightly and > 4 hours each night F/U in 1 year or sooner if needed   Butch Penny, MSN, NP-C 08/24/2023, 2:11 PM [facility] [address] [place], [place] [address] [phone]

## 2023-08-24 NOTE — Patient Instructions (Signed)
 Continue using CPAP nightly and greater than 4 hours each night If your symptoms worsen or you develop new symptoms please let us know.

## 2023-08-24 NOTE — Progress Notes (Signed)
 New, Doristine Mango, RN; Salena Saner; Kathe Becton; 1 other Received, thank you!     Previous Messages    ----- Message ----- From: Guy Begin, RN Sent: 08/24/2023   2:48 PM EDT To: Kathyrn Sheriff; Santina Evans; Kathe Becton; * Subject: needs new supplies                            New order in epic.  Loura Back Male, 38 y.o., 04-03-1986 MRN: [id number]  Andrey Campanile

## 2023-08-30 ENCOUNTER — Other Ambulatory Visit (HOSPITAL_BASED_OUTPATIENT_CLINIC_OR_DEPARTMENT_OTHER): Payer: Self-pay

## 2023-09-14 ENCOUNTER — Other Ambulatory Visit (HOSPITAL_BASED_OUTPATIENT_CLINIC_OR_DEPARTMENT_OTHER): Payer: Self-pay

## 2023-09-14 MED ORDER — MOUNJARO 15 MG/0.5ML ~~LOC~~ SOAJ
15.0000 mg | SUBCUTANEOUS | 2 refills | Status: AC
Start: 2023-09-14 — End: ?
  Filled 2023-09-14: qty 2, 28d supply, fill #0

## 2023-09-29 ENCOUNTER — Other Ambulatory Visit (HOSPITAL_BASED_OUTPATIENT_CLINIC_OR_DEPARTMENT_OTHER): Payer: Self-pay

## 2023-09-29 MED ORDER — ALLOPURINOL 100 MG PO TABS
200.0000 mg | ORAL_TABLET | Freq: Every day | ORAL | 3 refills | Status: AC
  Filled 2023-09-29 – 2023-10-09 (×2): qty 180, 90d supply, fill #0

## 2023-09-29 MED ORDER — MOUNJARO 15 MG/0.5ML ~~LOC~~ SOAJ: 15.0000 mg | SUBCUTANEOUS | 2 refills | Status: AC

## 2023-10-09 ENCOUNTER — Other Ambulatory Visit (HOSPITAL_BASED_OUTPATIENT_CLINIC_OR_DEPARTMENT_OTHER): Payer: Self-pay

## 2023-10-12 ENCOUNTER — Other Ambulatory Visit: Payer: Self-pay

## 2023-11-01 ENCOUNTER — Ambulatory Visit (HOSPITAL_COMMUNITY)
Admission: RE | Admit: 2023-11-01 | Discharge: 2023-11-01 | Disposition: A | Discharge status: Home / Self Care | Source: Ambulatory Visit

## 2023-11-01 ENCOUNTER — Other Ambulatory Visit: Payer: Self-pay

## 2023-11-01 ENCOUNTER — Encounter (HOSPITAL_COMMUNITY): Payer: Self-pay

## 2023-11-01 ENCOUNTER — Other Ambulatory Visit (HOSPITAL_BASED_OUTPATIENT_CLINIC_OR_DEPARTMENT_OTHER): Payer: Self-pay

## 2023-11-01 VITALS — BP 147/105 | HR 114 | Temp 98.3°F | Resp 18

## 2023-11-01 DIAGNOSIS — H1032 Unspecified acute conjunctivitis, left eye: Secondary | ICD-10-CM | POA: Diagnosis not present

## 2023-11-01 DIAGNOSIS — J069 Acute upper respiratory infection, unspecified: Secondary | ICD-10-CM | POA: Diagnosis not present

## 2023-11-01 HISTORY — DX: Type 2 diabetes mellitus without complications: E11.9

## 2023-11-01 MED ORDER — POLYMYXIN B-TRIMETHOPRIM 10000-0.1 UNIT/ML-% OP SOLN
1.0000 [drp] | OPHTHALMIC | 0 refills | Status: AC
  Filled 2023-11-01: qty 10, 16d supply, fill #0

## 2023-11-01 NOTE — ED Triage Notes (Addendum)
 C/O cold sxs onset 5 days ago. States sxs have gotten worse - c/o greenish nasal discharge now, rhinorrhea, cough. Denies fevers. States had neg home Covid test 3 days ago. Yesterday woke with left eye irritation and sensation of foreign body; now having milky discharge from left eye. C/O slightly blurred vision in left eye. Does not wear contact lenses. Took Dayquil Severe Cold @ 1000 today.

## 2023-11-01 NOTE — Discharge Instructions (Signed)
 We have sent in an antibiotic eyedrop to cover for possible bacterial conjunctivitis.  Please return or follow-up with ophthalmologist if you have any worsening in vision, swelling of the eye, pain with eye movement, or if you have any other concerns.  For most people and most upper respiratory infections, symptoms are self-limited. The usual course and duration of illness is up to 7-10 days. Because upper respiratory infections are usually caused by viruses, antibiotics will not help and often cause nausea and diarrhea.  Analgesics like Tylenol (acetaminophen) or Motrin (ibuprofen) may be used to relieve associated symptoms (eg, headache, ear pain, muscle and joint pains, and malaise). Supportive therapies are the main treatments you can perform including getting plenty of rest, drinking lots of fluids (water, juice, or broth), having warm tea or soup to help with sore throat, using cool-mist humidifier, using saline nose drops or spray to relieve stuffiness, and avoid smoking or being around smoke. You can also try over-the-counter medications including cough medicines that include an antihistamine and decongestant, cough drops, throat sprays, or inhaled/intranasal cromolyn sodium. You should return to the ED or UC if you experience any difficulty breathing, cough up blood, your symptoms do not improve/worsen, or if you are unable to keep down any food or liquids. Lastly, you should follow-up with your primary care provider in the next several days.

## 2023-11-01 NOTE — ED Provider Notes (Signed)
 [facility]    CSN: [id number] Arrival date & time: 11/01/23  1143      History   Chief Complaint Chief Complaint  Patient presents with   Eye Problem   Appt 1200    HPI Jeffery Soto is a 38 y.o. male.   HPI Patient is a 38 year old male who presents to the urgent care today with concerns of possible pinkeye.  Patient is also reporting with cough, congestion, and sore throat.  He reports that his upper respiratory symptoms began on Wednesday.  He has been taking some over-the-counter cough medications which has been helping somewhat with his symptoms. He states that his kids have had similar symptoms over the past 2 weeks.  Several days ago, he woke up and noticed that he had matting of the left eye.  He also felt as if he had a foreign body in the left eye.  He has noticed some drainage from it since then.  He also reports that the vision has been somewhat blurry when he wakes up until he wipes with the eye.  He does not wear contacts.  He denies any eye pain, eye swelling or swelling around the eye, fever, pain with extraocular movement, changes or loss of vision, chest pain, shortness of breath, teeth pain, sinus pain, or other concerns at this time. Past Medical History:  Diagnosis Date   Asthma    Fracture, ankle    High cholesterol    Hyperthyroidism    Type II diabetes mellitus [facility])     Patient Active Problem List   Diagnosis Date Noted   Closed fracture of sesamoid bone of left foot 05/08/2016   Paresthesia 09/27/2013   Obstructive sleep apnea 09/27/2013    Past Surgical History:  Procedure Laterality Date   COLONOSCOPY WITH PROPOFOL  N/A 07/09/2023   Procedure: COLONOSCOPY WITH PROPOFOL ;  Surgeon: Shane Darling, MD;  Location: [facility] ENDOSCOPY;  Service: Endoscopy;  Laterality: N/A;   MOUTH SURGERY  05/18/2012   POLYPECTOMY  07/09/2023   Procedure: POLYPECTOMY;  Surgeon: Shane Darling, MD;  Location: [facility] ENDOSCOPY;  Service: Endoscopy;;        Home Medications    Prior to Admission medications   Medication Sig Start Date End Date Taking? Authorizing Provider  allopurinol  (ZYLOPRIM ) 100 MG tablet Take 2 tablets (200 mg total) by mouth daily. 09/29/23  Yes   escitalopram  (LEXAPRO ) 10 MG tablet Take 1 tablet (10 mg total) by mouth in the morning. 08/19/23  Yes   levothyroxine  (SYNTHROID ) 75 MCG tablet Take 1 tablet (75 mcg total) by mouth daily. Take on an empty stomach with a glass of water at least 30-60 minutes before breakfast. 08/02/23  Yes   tirzepatide  (MOUNJARO ) 15 MG/0.5ML Pen Inject 15 mg into the skin once a week. 09/29/23  Yes   trimethoprim-polymyxin b (POLYTRIM) ophthalmic solution Place 1 drop into the left eye every 4 (four) hours for 7 days. 1 to 2 drops every 4 hours while awake 11/01/23 11/08/23 Yes Edna Gouty, PA-C  albuterol  (PROVENTIL  HFA;VENTOLIN  HFA) 108 (90 Base) MCG/ACT inhaler Inhale into the lungs. Patient not taking: Reported on 01/13/2021    [provider]  albuterol  (VENTOLIN  HFA) 108 (90 Base) MCG/ACT inhaler Inhale 2 puffs into the lungs every 6 (six) hours as needed for wheezing. 05/21/22     allopurinol  (ZYLOPRIM ) 100 MG tablet Take 1 tablet (100 mg total) by mouth daily. 12/29/22     allopurinol  (ZYLOPRIM ) 100 MG tablet Take 1 tablet (  100 mg total) by mouth daily. 04/26/23     allopurinol  (ZYLOPRIM ) 100 MG tablet Take 2 tablets (200 mg total) by mouth daily. 06/21/23     azithromycin  (ZITHROMAX ) 250 MG tablet Take 2 tablets by mouth on day 1, then 1 tablet by mouth daily for 4 days 06/08/22     colchicine  0.6 MG tablet Take 2 tablets (1.2 mg) at onset of gout flare. Take 1 additional tablet (0.6 mg) one hour later if symptoms persist. Maximum dose is 1.8 mg (3 tablets) in 24 hours. Then take one tablet (0.6 mg) daily until symptoms resolve. 12/29/22     doxycycline  (ADOXA) 100 MG tablet Take 1 tablet (100 mg total) by mouth in the morning and 1 tablet (100 mg total) in the evening. Take with a  full glass of water and do not lie down for at least 30 minutes after. 05/21/22     escitalopram  (LEXAPRO ) 10 MG tablet Take 20 mg by mouth every morning. 04/02/19   [provider]  escitalopram  (LEXAPRO ) 10 MG tablet Take 1 tablet (10 mg total) by mouth every morning. 03/26/22     escitalopram  (LEXAPRO ) 10 MG tablet Take 1 tablet (10 mg total) by mouth every morning 07/14/22     escitalopram  (LEXAPRO ) 10 MG tablet Take 1 tablet (10 mg total) by mouth in the morning. 02/16/23     escitalopram  (LEXAPRO ) 10 MG tablet Take 1 tablet (10 mg total) by mouth daily every morning 05/24/23     Fexofenadine HCl (ALLEGRA PO) Take by mouth as needed.    [provider]  GLYXAMBI  25-5 MG TABS Take 1 tablet by mouth once daily 01/27/22     levothyroxine  (SYNTHROID ) 75 MCG tablet Take 1 tablet (75 mcg total) by mouth once daily. Take on an empty stomach with a glass of water at least 30-60 minutes before breakfast. 06/08/22     levothyroxine  (SYNTHROID ) 75 MCG tablet Take 1 tablet (75 mcg total) by mouth daily. Take on an empty stomach with a glass of water at least 30-60 minutes before breakfast. 09/07/22     levothyroxine  (SYNTHROID ) 75 MCG tablet Take 1 tablet (75 mcg total) by mouth once daily, Take on an empty stomach with a glass of water at least 30-60 minutes before breakfast. 12/11/22     levothyroxine  (SYNTHROID ) 75 MCG tablet Take 1 tablet (75 mcg total) by mouth daily.Take on an empty stomach with a glass of water at least 30-60 minutes before breakfast. 03/31/23     levothyroxine  (SYNTHROID , LEVOTHROID) 75 MCG tablet Take 1 tablet (75 mcg total) by mouth daily. Follow up for repeat testing in 2 months 04/25/15   Media Spikes, MD  metFORMIN  (GLUCOPHAGE -XR) 500 MG 24 hr tablet Take 2 tablets (1,000 mg total) by mouth daily with supper. 07/01/22     metFORMIN  (GLUCOPHAGE -XR) 500 MG 24 hr tablet Take 2 tablets (1,000 mg total) by mouth daily with dinner 08/04/22     metFORMIN  (GLUCOPHAGE -XR) 500  MG 24 hr tablet Take 2 tablets (1,000 mg total) by mouth daily with supper. 09/07/22     Na Sulfate-K Sulfate-Mg Sulf 17.5-3.13-1.6 GM/177ML SOLN Take both bottles at the times instructed by your provider. 03/19/23     predniSONE  (DELTASONE ) 10 MG tablet Take as directed on attached sheet. 05/21/22     predniSONE  (DELTASONE ) 20 MG tablet Take 3 tablets (60 mg total) by mouth once daily for 2 days, THEN 2 tablets (40 mg total) once daily for 2 days, THEN  1 tablet (20 mg total) once daily for 2 days. 06/08/22     predniSONE  (DELTASONE ) 50 MG tablet Take 1 tablet (50 mg total) by mouth daily for 5 days. 09/11/22   Deatra Face, MD  tirzepatide  (MOUNJARO ) 12.5 MG/0.5ML Pen Inject 12.5 mg into the skin every 7 (seven) days. 12/29/22     tirzepatide  (MOUNJARO ) 15 MG/0.5ML Pen Inject 15 mg into the skin every 7 (seven) days. 12/29/22     tirzepatide  (MOUNJARO ) 15 MG/0.5ML Pen Inject 15 mg into the skin once a week. 03/08/23     tirzepatide  (MOUNJARO ) 15 MG/0.5ML Pen Inject 15 mg into the skin every 7 (seven) days. 04/14/23     tirzepatide  (MOUNJARO ) 15 MG/0.5ML Pen Inject 0.5 mLs (15 mg total) subcutaneously every 7 (seven) days 05/24/23     tirzepatide  (MOUNJARO ) 15 MG/0.5ML Pen Inject 15 mg into the skin once a week. 06/21/23     tirzepatide  (MOUNJARO ) 15 MG/0.5ML Pen Inject 15 mg into the skin every 7 (seven) days. 08/02/23     tirzepatide  (MOUNJARO ) 15 MG/0.5ML Pen Inject 15 mg into the skin once a week. 08/12/23     tirzepatide  (MOUNJARO ) 15 MG/0.5ML Pen Inject 15 mg into the skin every 7 (seven) days. 09/14/23     tirzepatide  (MOUNJARO ) 2.5 MG/0.5ML Pen Inject 2.5 mg into the skin once a week. 08/10/22     tirzepatide  (MOUNJARO ) 5 MG/0.5ML Pen Inject 0.5 mLs (5 mg total) subcutaneously every 7 (seven) days 06/08/22     tirzepatide  (MOUNJARO ) 7.5 MG/0.5ML Pen Inject 0.5 mLs (7.5 mg total) subcutaneously every 7 (seven) days 06/08/22     tirzepatide  (MOUNJARO ) 7.5 MG/0.5ML Pen Inject 7.5 mg into the skin once a week.  09/15/22     tirzepatide  (MOUNJARO ) 7.5 MG/0.5ML Pen Inject 0.5 mLs (7.5 mg total) under the skin every 7 (seven) days. 10/15/22     tirzepatide  (MOUNJARO ) 7.5 MG/0.5ML Pen Inject 7.5 mg into the skin every 7 (seven) days. 10/27/22       Family History Family History  Problem Relation Age of Onset   Heart attack Paternal Grandfather    Irritable bowel syndrome Mother    Arthritis Mother     Social History Social History   Tobacco Use   Smoking status: Former   Smokeless tobacco: Never   Tobacco comments:     Two cigars, or cigs per week.    Vaping Use   Vaping status: Every Day   Substances: Nicotine, Flavoring  Substance Use Topics   Alcohol use: Yes    Comment: occasionally   Drug use: Not Currently    Types: Marijuana     Allergies   Amoxicillin and Penicillins   Review of Systems Review of Systems See HPI for relevant ROS.  Physical Exam Triage Vital Signs ED Triage Vitals  Encounter Vitals Group     BP 11/01/23 1204 (!) 147/105     Girls Systolic BP Percentile --      Girls Diastolic BP Percentile --      Boys Systolic BP Percentile --      Boys Diastolic BP Percentile --      Pulse Rate 11/01/23 1204 (!) 114     Resp 11/01/23 1204 18     Temp 11/01/23 1204 98.3 F (36.8 C)     Temp Source 11/01/23 1204 Oral     SpO2 11/01/23 1204 97 %     Weight --      Height --      Head Circumference --  Peak Flow --      Pain Score 11/01/23 1207 0     Pain Loc --      Pain Education --      Exclude from Growth Chart --    No data found.  Updated Vital Signs BP (!) 147/105   Pulse (!) 114   Temp 98.3 F (36.8 C) (Oral)   Resp 18   SpO2 97%   Visual Acuity Right Eye Distance: 20/20 Left Eye Distance: 20/20 Bilateral Distance: 20/20  Right Eye Near:   Left Eye Near:    Bilateral Near:     Physical Exam General: Alert and oriented, well-developed/well-nourished, calm, cooperative, no acute distress HEENT: Normocephalic atraumatic, moist  mucous membranes, no scleral icterus, trachea midline, conjunctival injection of the left eye, no pain with extraocular movement, PERRL Lungs: Speaking full sentences, non-labored respirations, no distress, wheezing present on auscultation  Heart: Regular rhythm Abdomen:  Soft, nondistended Musculoskeletal: Moves all extremities well Neurologic: Awake, A&O x4, gait normal Integumentary: Warm, dry, normal for ethnicity, intact, no rash Psychiatric: Appropriate mood & affect  UC Treatments / Results  Labs (all labs ordered are listed, but only abnormal results are displayed) Labs Reviewed - No data to display  EKG   Radiology No results found.  Procedures Procedures (including critical care time)  Medications Ordered in UC Medications - No data to display  Initial Impression / Assessment and Plan / UC Course  I have reviewed the triage vital signs and the nursing notes.  Pertinent labs & imaging results that were available during my care of the patient were reviewed by me and considered in my medical decision making (see chart for details).    Presents with left eye irritation and drainage.  Differential Diagnosis: Bacterial conjunctivitis, allergic conjunctivitis, viral conjunctivitis, URI, pneumonia, corneal abrasion, blepharitis, keratitis, uveitis, acute closure glaucoma, preseptal cellulitis, orbital cellulitis, including other diagnoses.  Rationale: Patient presents with irritation and drainage of the left eye. No recent eye trauma or suspected microtrauma (dust, sand, etc). Patient does not wear contacts.  No significant photophobia. Due to frequent discharge, bacterial conjunctivitis more likely.  Prescribed trimethoprim polymyxin B for treatment.  Additionally, patient presents with likely viral upper respiratory infection.  Patient did have some wheezing on auscultation.  Patient has a history of asthma in the past.  Recommended patient use his albuterol  inhaler which he  has not used in a long time to help with his symptoms. Recommended patient follow-up with their primary care provider or eye doctor in the next several days if symptoms or not improving.  Discussed return precautions to the urgent care or emergency department including if patient develops fever, swelling of the eyelid, pain with extraocular movement, neurologic symptoms, worsening of symptoms, or if they have any other concerns.  Disposition: Stable to discharge home.   All questions answered to the best of this examiner's ability. Reviewed possible severe sequelae and other reasons to return to urgent care or ED for further evaluation and/or treatment. Advised to f/u PCP w/in 48 to 72 hours for further eval and/or reassessment. Patient voices understanding of the above and agrees to plan.  An appropriate evaluation has been performed, and in my medical judgment there is currently no evidence of an immediate life-threatening or surgical condition. Discharge is therefore indicated at this time.  This document was created using the aid of voice recognition Scientist, clinical (histocompatibility and immunogenetics).  Final Clinical Impressions(s) / UC Diagnoses   Final diagnoses:  Acute bacterial conjunctivitis of  left eye  Acute upper respiratory infection     Discharge Instructions      We have sent in an antibiotic eyedrop to cover for possible bacterial conjunctivitis.  Please return or follow-up with ophthalmologist if you have any worsening in vision, swelling of the eye, pain with eye movement, or if you have any other concerns.  For most people and most upper respiratory infections, symptoms are self-limited. The usual course and duration of illness is up to 7-10 days. Because upper respiratory infections are usually caused by viruses, antibiotics will not help and often cause nausea and diarrhea.  Analgesics like Tylenol (acetaminophen) or Motrin (ibuprofen) may be used to relieve associated symptoms (eg, headache, ear  pain, muscle and joint pains, and malaise). Supportive therapies are the main treatments you can perform including getting plenty of rest, drinking lots of fluids (water, juice, or broth), having warm tea or soup to help with sore throat, using cool-mist humidifier, using saline nose drops or spray to relieve stuffiness, and avoid smoking or being around smoke. You can also try over-the-counter medications including cough medicines that include an antihistamine and decongestant, cough drops, throat sprays, or inhaled/intranasal cromolyn sodium. You should return to the ED or UC if you experience any difficulty breathing, cough up blood, your symptoms do not improve/worsen, or if you are unable to keep down any food or liquids. Lastly, you should follow-up with your primary care provider in the next several days.     ED Prescriptions     Medication Sig Dispense Auth. Provider   trimethoprim-polymyxin b (POLYTRIM) ophthalmic solution Place 1 drop into the left eye every 4 (four) hours for 7 days. 1 to 2 drops every 4 hours while awake 10 mL Edna Gouty, PA-C      PDMP not reviewed this encounter.   Edna Gouty, PA-C 11/01/23 1304

## 2023-11-29 ENCOUNTER — Other Ambulatory Visit (HOSPITAL_BASED_OUTPATIENT_CLINIC_OR_DEPARTMENT_OTHER): Payer: Self-pay

## 2023-11-29 MED ORDER — MOUNJARO 15 MG/0.5ML ~~LOC~~ SOAJ
15.0000 mg | SUBCUTANEOUS | 2 refills | Status: AC
  Filled 2023-11-29: qty 2, 28d supply, fill #0

## 2023-11-29 MED ORDER — LEVOTHYROXINE SODIUM 75 MCG PO TABS
75.0000 ug | ORAL_TABLET | Freq: Every day | ORAL | 3 refills | Status: AC
  Filled 2023-11-29: qty 90, 90d supply, fill #0

## 2023-12-24 ENCOUNTER — Other Ambulatory Visit (HOSPITAL_BASED_OUTPATIENT_CLINIC_OR_DEPARTMENT_OTHER): Payer: Self-pay

## 2023-12-24 MED ORDER — MOUNJARO 15 MG/0.5ML ~~LOC~~ SOAJ
15.0000 mg | SUBCUTANEOUS | 2 refills | Status: AC
  Filled 2023-12-24: qty 2, 28d supply, fill #0

## 2024-01-18 ENCOUNTER — Other Ambulatory Visit (HOSPITAL_BASED_OUTPATIENT_CLINIC_OR_DEPARTMENT_OTHER): Payer: Self-pay

## 2024-01-18 MED ORDER — ESCITALOPRAM OXALATE 10 MG PO TABS
10.0000 mg | ORAL_TABLET | Freq: Every morning | ORAL | 3 refills | Status: AC
  Filled 2024-01-18: qty 90, 90d supply, fill #0

## 2024-01-18 MED ORDER — ALLOPURINOL 100 MG PO TABS
200.0000 mg | ORAL_TABLET | Freq: Every day | ORAL | 3 refills | Status: AC
  Filled 2024-01-18: qty 180, 90d supply, fill #0

## 2024-01-18 MED ORDER — MOUNJARO 15 MG/0.5ML ~~LOC~~ SOAJ
15.0000 mg | SUBCUTANEOUS | 2 refills | Status: AC
  Filled 2024-01-18: qty 2, 28d supply, fill #0

## 2024-02-28 ENCOUNTER — Other Ambulatory Visit (HOSPITAL_BASED_OUTPATIENT_CLINIC_OR_DEPARTMENT_OTHER): Payer: Self-pay

## 2024-02-28 MED ORDER — MOUNJARO 15 MG/0.5ML ~~LOC~~ SOAJ
15.0000 mg | SUBCUTANEOUS | 2 refills | Status: AC
  Filled 2024-02-28: qty 2, 28d supply, fill #0

## 2024-03-13 ENCOUNTER — Other Ambulatory Visit (HOSPITAL_BASED_OUTPATIENT_CLINIC_OR_DEPARTMENT_OTHER): Payer: Self-pay

## 2024-03-13 MED ORDER — LEVOTHYROXINE SODIUM 75 MCG PO TABS
75.0000 ug | ORAL_TABLET | Freq: Every morning | ORAL | 3 refills | Status: AC
  Filled 2024-03-13: qty 90, 90d supply, fill #0

## 2024-03-14 ENCOUNTER — Other Ambulatory Visit (HOSPITAL_BASED_OUTPATIENT_CLINIC_OR_DEPARTMENT_OTHER): Payer: Self-pay

## 2024-04-04 ENCOUNTER — Other Ambulatory Visit (HOSPITAL_BASED_OUTPATIENT_CLINIC_OR_DEPARTMENT_OTHER): Payer: Self-pay

## 2024-04-04 ENCOUNTER — Other Ambulatory Visit: Payer: Self-pay

## 2024-04-04 MED ORDER — MOUNJARO 15 MG/0.5ML ~~LOC~~ SOAJ
15.0000 mg | SUBCUTANEOUS | 2 refills | Status: AC
  Filled 2024-04-04: qty 2, 28d supply, fill #0

## 2024-04-04 MED ORDER — ALLOPURINOL 100 MG PO TABS: 200.0000 mg | ORAL_TABLET | Freq: Every day | ORAL | 3 refills | Status: AC

## 2024-05-16 ENCOUNTER — Other Ambulatory Visit (HOSPITAL_BASED_OUTPATIENT_CLINIC_OR_DEPARTMENT_OTHER): Payer: Self-pay

## 2024-05-16 ENCOUNTER — Other Ambulatory Visit: Payer: Self-pay

## 2024-05-16 MED ORDER — MOUNJARO 15 MG/0.5ML ~~LOC~~ SOAJ
SUBCUTANEOUS | 2 refills | Status: AC
  Filled 2024-05-16: qty 2, 28d supply, fill #0

## 2024-05-16 MED ORDER — LEVOTHYROXINE SODIUM 75 MCG PO TABS
75.0000 ug | ORAL_TABLET | Freq: Every day | ORAL | 3 refills | Status: AC
  Filled 2024-05-16: qty 90, 90d supply, fill #0

## 2024-05-16 MED ORDER — ESCITALOPRAM OXALATE 10 MG PO TABS
10.0000 mg | ORAL_TABLET | Freq: Every morning | ORAL | 3 refills | Status: AC
  Filled 2024-05-16: qty 90, 90d supply, fill #0

## 2024-05-22 ENCOUNTER — Other Ambulatory Visit (HOSPITAL_BASED_OUTPATIENT_CLINIC_OR_DEPARTMENT_OTHER): Payer: Self-pay

## 2024-06-02 ENCOUNTER — Other Ambulatory Visit (HOSPITAL_BASED_OUTPATIENT_CLINIC_OR_DEPARTMENT_OTHER): Payer: Self-pay

## 2024-06-02 MED ORDER — ALLOPURINOL 100 MG PO TABS
200.0000 mg | ORAL_TABLET | Freq: Every day | ORAL | 3 refills | Status: AC
  Filled 2024-06-02: qty 180, 90d supply, fill #0

## 2024-06-05 ENCOUNTER — Other Ambulatory Visit (HOSPITAL_BASED_OUTPATIENT_CLINIC_OR_DEPARTMENT_OTHER): Payer: Self-pay

## 2024-08-15 ENCOUNTER — Telehealth: Admitting: Adult Health
